# Patient Record
Sex: Female | Born: 1975 | Race: White | Hispanic: No | Smoking: Current every day smoker
Health system: Southern US, Community
[De-identification: ages and names within clinical notes are randomized; demographics above are authoritative.]

## PROBLEM LIST (undated history)

## (undated) DIAGNOSIS — E119 Type 2 diabetes mellitus without complications: Secondary | ICD-10-CM

## (undated) DIAGNOSIS — I219 Acute myocardial infarction, unspecified: Secondary | ICD-10-CM

## (undated) DIAGNOSIS — N200 Calculus of kidney: Secondary | ICD-10-CM

## (undated) HISTORY — DX: Type 2 diabetes mellitus without complications: E11.9

## (undated) HISTORY — DX: Acute myocardial infarction, unspecified: I21.9

## (undated) HISTORY — DX: Calculus of kidney: N20.0

## (undated) HISTORY — PX: CHOLECYSTECTOMY: SHX55

---

## 2008-03-30 ENCOUNTER — Ambulatory Visit: Payer: Self-pay | Admitting: Cardiology

## 2008-08-02 ENCOUNTER — Ambulatory Visit (HOSPITAL_COMMUNITY): Admission: RE | Admit: 2008-08-02 | Discharge: 2008-08-02 | Payer: Self-pay | Admitting: Unknown Physician Specialty

## 2008-09-03 ENCOUNTER — Encounter: Admission: RE | Admit: 2008-09-03 | Discharge: 2008-09-03 | Payer: Self-pay | Admitting: Unknown Physician Specialty

## 2008-09-03 ENCOUNTER — Ambulatory Visit (HOSPITAL_COMMUNITY): Admission: RE | Admit: 2008-09-03 | Discharge: 2008-09-03 | Payer: Self-pay | Admitting: Unknown Physician Specialty

## 2008-09-17 ENCOUNTER — Ambulatory Visit (HOSPITAL_COMMUNITY): Admission: RE | Admit: 2008-09-17 | Discharge: 2008-09-17 | Payer: Self-pay | Admitting: Unknown Physician Specialty

## 2008-10-15 ENCOUNTER — Ambulatory Visit (HOSPITAL_COMMUNITY): Admission: RE | Admit: 2008-10-15 | Discharge: 2008-10-15 | Payer: Self-pay | Admitting: Unknown Physician Specialty

## 2008-10-16 ENCOUNTER — Ambulatory Visit: Payer: Self-pay | Admitting: Cardiology

## 2008-10-16 ENCOUNTER — Encounter: Payer: Self-pay | Admitting: Cardiology

## 2008-10-16 DIAGNOSIS — F411 Generalized anxiety disorder: Secondary | ICD-10-CM | POA: Insufficient documentation

## 2008-10-16 DIAGNOSIS — E119 Type 2 diabetes mellitus without complications: Secondary | ICD-10-CM

## 2008-10-16 DIAGNOSIS — R0602 Shortness of breath: Secondary | ICD-10-CM | POA: Insufficient documentation

## 2008-10-16 DIAGNOSIS — I10 Essential (primary) hypertension: Secondary | ICD-10-CM

## 2008-11-12 ENCOUNTER — Ambulatory Visit (HOSPITAL_COMMUNITY): Admission: RE | Admit: 2008-11-12 | Discharge: 2008-11-12 | Payer: Self-pay | Admitting: Unknown Physician Specialty

## 2008-12-04 ENCOUNTER — Ambulatory Visit (HOSPITAL_COMMUNITY): Admission: RE | Admit: 2008-12-04 | Discharge: 2008-12-04 | Payer: Self-pay | Admitting: Unknown Physician Specialty

## 2008-12-24 ENCOUNTER — Inpatient Hospital Stay (HOSPITAL_COMMUNITY): Admission: AD | Admit: 2008-12-24 | Discharge: 2008-12-24 | Payer: Self-pay | Admitting: Obstetrics & Gynecology

## 2008-12-24 ENCOUNTER — Ambulatory Visit: Payer: Self-pay | Admitting: Family Medicine

## 2009-01-03 ENCOUNTER — Ambulatory Visit (HOSPITAL_COMMUNITY): Admission: RE | Admit: 2009-01-03 | Discharge: 2009-01-03 | Payer: Self-pay | Admitting: Unknown Physician Specialty

## 2009-07-28 IMAGING — US US OB FOLLOW-UP
1 series · 18 of 28 positions shown · non-contrast
Comparison: none

OBSTETRICAL ULTRASOUND:
 This ultrasound was performed in The [HOSPITAL], and the AS OB/GYN report will be stored to [REDACTED] PACS.

[Series 1: us ob follow-up · 18 of 36 slices shown]
[im 1/36]
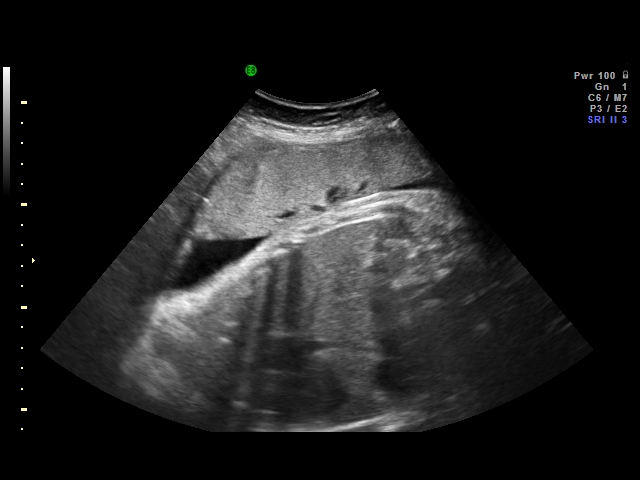
[im 3/36]
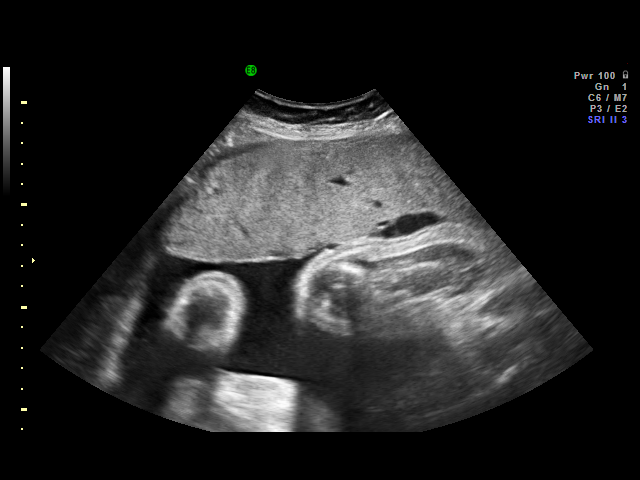
[im 4/36]
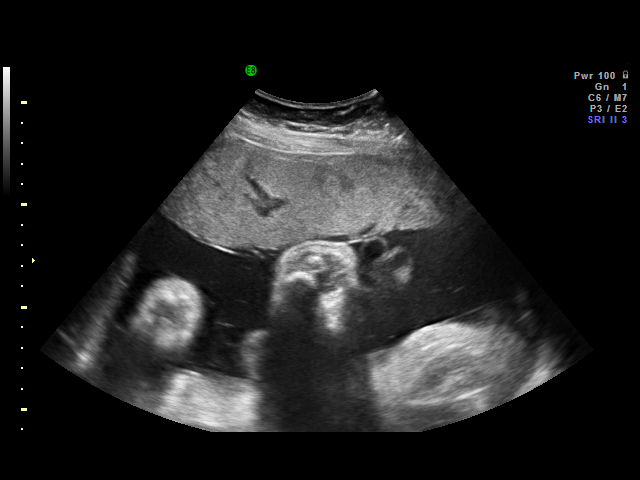
[im 7/36]
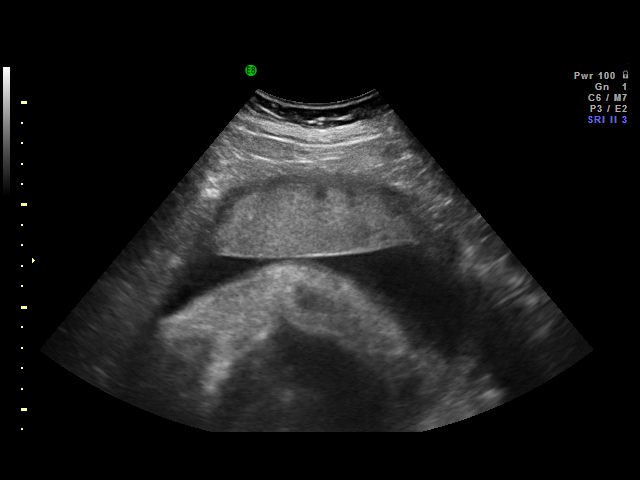
[im 10/36]
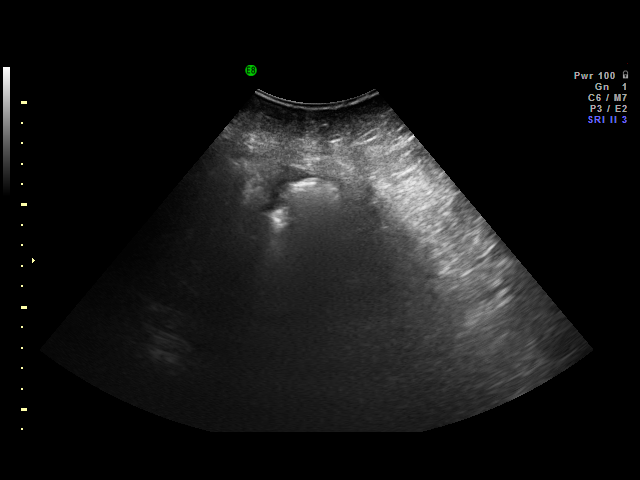
[im 11/36]
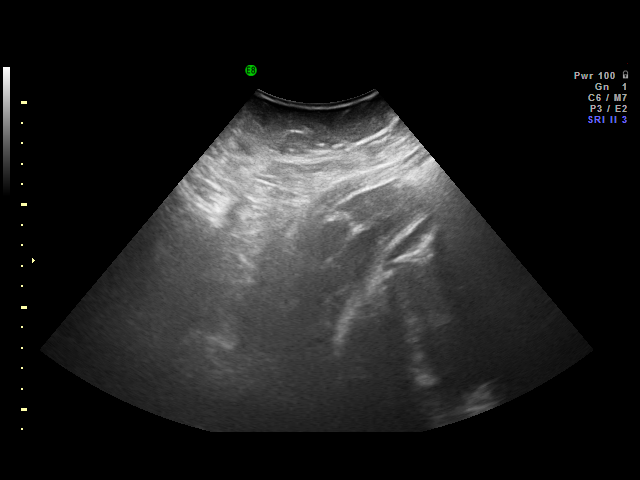
[im 13/36]
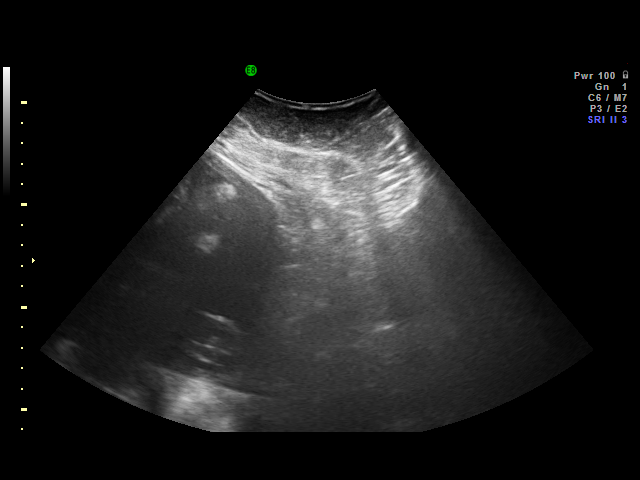
[im 15/36]
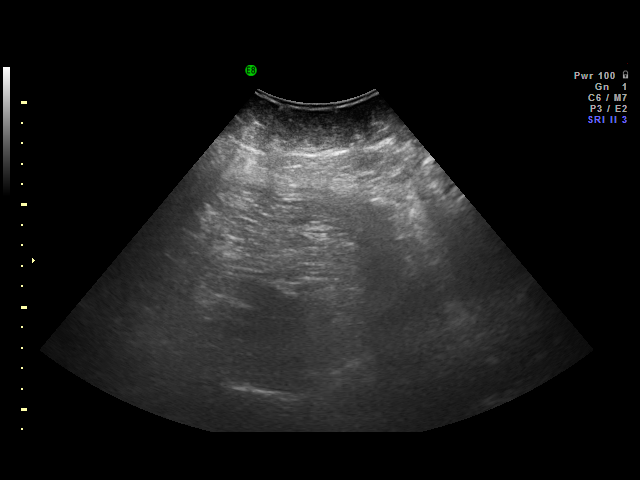
[im 17/36]
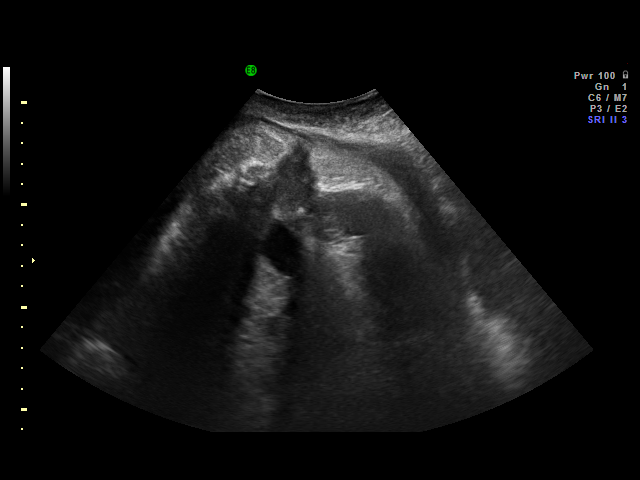
[im 19/36]
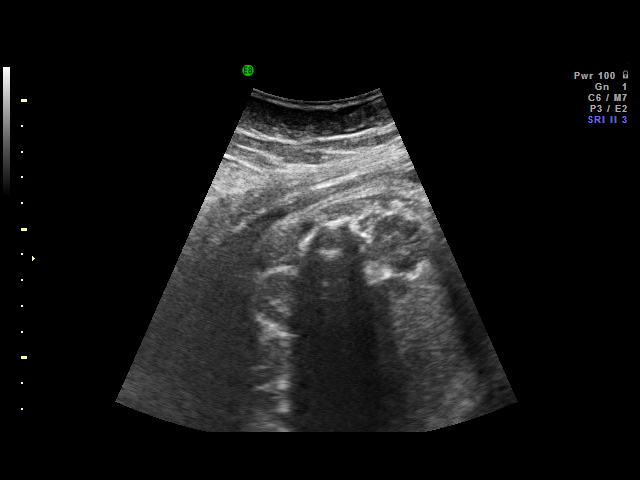
[im 21/36]
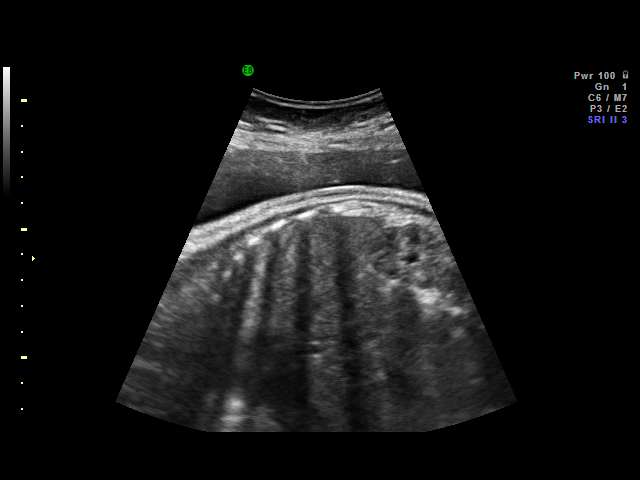
[im 23/36]
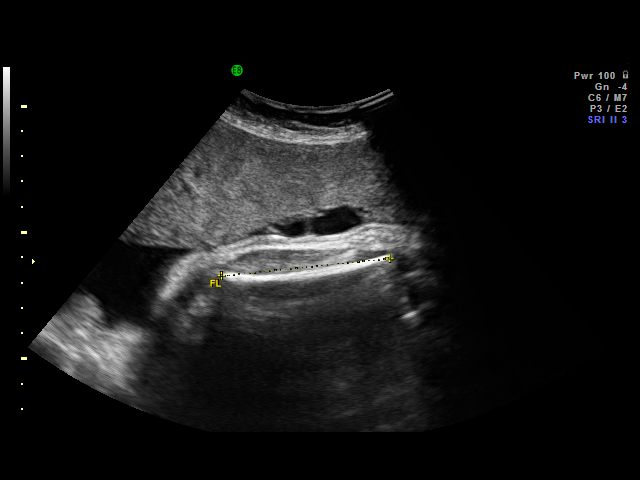
[im 25/36]
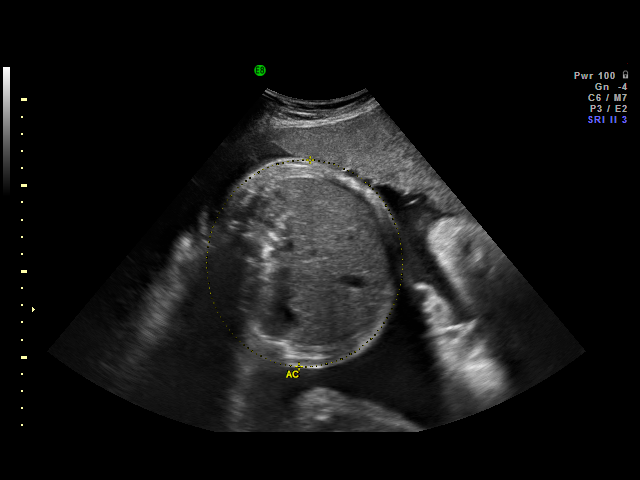
[im 28/36]
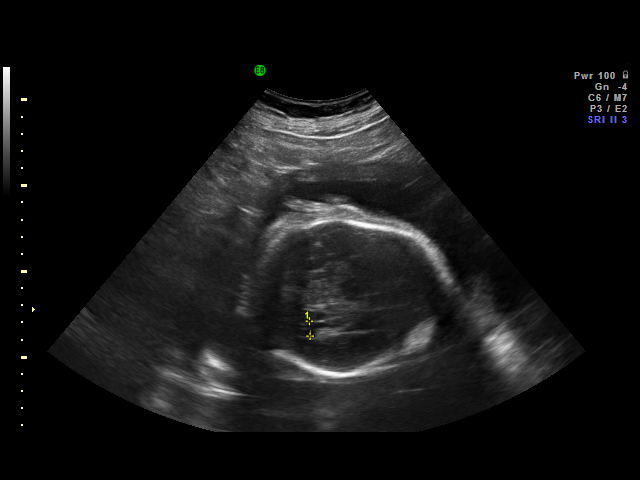
[im 29/36]
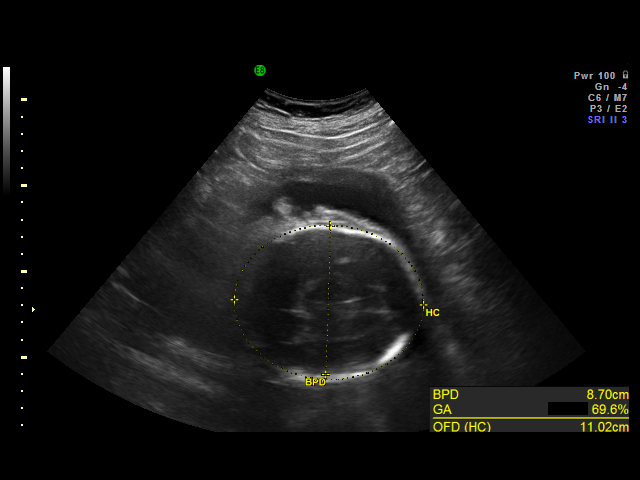
[im 32/36]
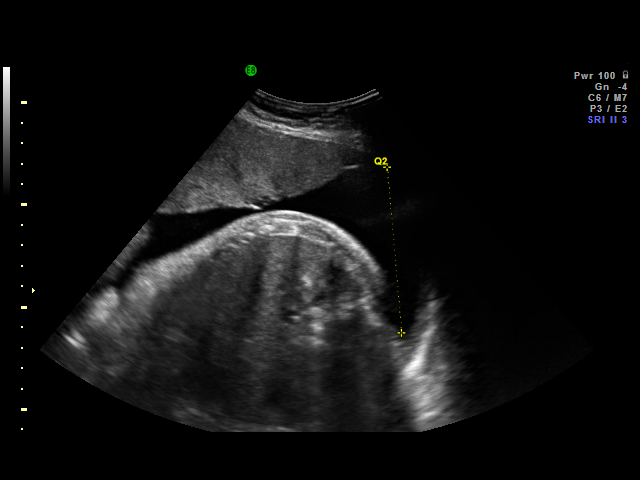
[im 33/36]
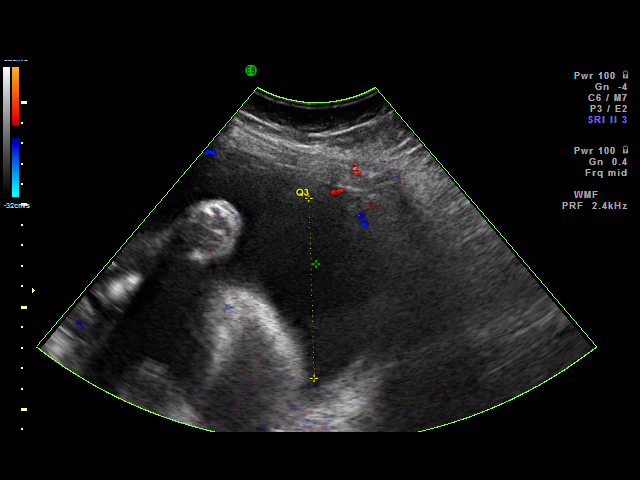
[im 36/36]
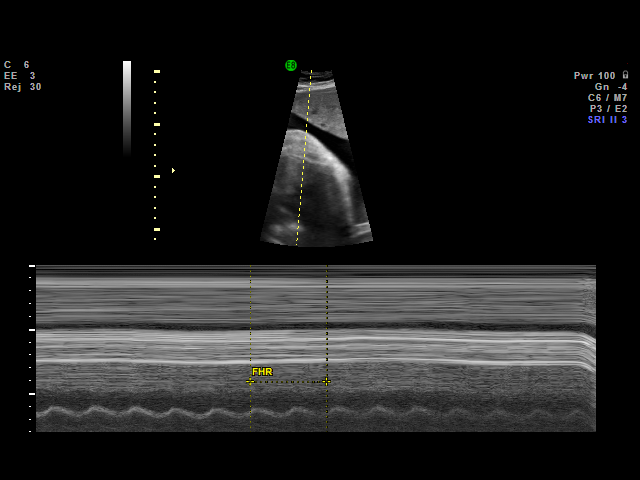

[18 of 28 positions shown; findings below may reference images not displayed]

IMPRESSION: AS OB/GYN has also been faxed to the ordering physician.

## 2010-12-07 ENCOUNTER — Encounter: Payer: Self-pay | Admitting: Unknown Physician Specialty

## 2011-03-03 LAB — FETAL FIBRONECTIN: Fetal Fibronectin: NEGATIVE

## 2011-03-03 LAB — GC/CHLAMYDIA PROBE AMP, GENITAL
Chlamydia, DNA Probe: NEGATIVE
GC Probe Amp, Genital: NEGATIVE

## 2011-03-03 LAB — URINALYSIS, ROUTINE W REFLEX MICROSCOPIC
Glucose, UA: >1000 mg/dL — AB
Hgb urine dipstick: NEGATIVE
Ketones, ur: NEGATIVE mg/dL
Leukocytes, UA: NEGATIVE
Nitrite: NEGATIVE
Specific Gravity, Urine: 1.02 (ref 1.005–1.030)
Urobilinogen, UA: 0.2 mg/dL (ref 0.0–1.0)

## 2011-03-03 LAB — URINE MICROSCOPIC-ADD ON

## 2011-03-03 LAB — STREP B DNA PROBE

## 2011-03-03 LAB — WET PREP, GENITAL: Clue Cells Wet Prep HPF POC: NONE SEEN

## 2016-09-08 ENCOUNTER — Encounter: Payer: Self-pay | Admitting: Pediatrics

## 2016-09-08 ENCOUNTER — Ambulatory Visit (INDEPENDENT_AMBULATORY_CARE_PROVIDER_SITE_OTHER): Payer: Medicaid Other | Admitting: Pediatrics

## 2016-09-08 VITALS — BP 183/101 | HR 78 | Temp 98.7°F | Ht 64.0 in | Wt 238.0 lb

## 2016-09-08 DIAGNOSIS — Z720 Tobacco use: Secondary | ICD-10-CM

## 2016-09-08 DIAGNOSIS — F419 Anxiety disorder, unspecified: Principal | ICD-10-CM

## 2016-09-08 DIAGNOSIS — Z23 Encounter for immunization: Secondary | ICD-10-CM | POA: Diagnosis not present

## 2016-09-08 DIAGNOSIS — F418 Other specified anxiety disorders: Secondary | ICD-10-CM | POA: Diagnosis not present

## 2016-09-08 DIAGNOSIS — I10 Essential (primary) hypertension: Secondary | ICD-10-CM

## 2016-09-08 DIAGNOSIS — F329 Major depressive disorder, single episode, unspecified: Secondary | ICD-10-CM

## 2016-09-08 MED ORDER — HYDROCHLOROTHIAZIDE 25 MG PO TABS: 25.0000 mg | ORAL_TABLET | Freq: Every day | ORAL | 3 refills | Status: DC

## 2016-09-08 MED ORDER — ESCITALOPRAM OXALATE 10 MG PO TABS: 10.0000 mg | ORAL_TABLET | Freq: Every day | ORAL | 2 refills | Status: DC

## 2016-09-08 NOTE — Progress Notes (Signed)
Subjective:   Patient ID: Mary Riggs, female    DOB: 1976/06/01, 40 y.o.   MRN: [id number] CC: New Patient (Initial Visit); Hypertension; Left shoulder pain; and Nevus  HPI: Mary Riggs is a 40 y.o. female presenting for New Patient (Initial Visit); Hypertension; Left shoulder pain; and Nevus  HTN: ongoing for years, had HTN during pregnancy 7 yrs ago Also had gestational diabetes Headaches daily, can tolerate it, takes tylenol if it gets bad No chest pain  Anxiety: Ongoing for years Panic attacks every day Feels on edge all the time  10yo son, 1 yo daughter at home Mood has been down Has passive thoughts abou tnot wanting to be here No plan of suicide or self harm  Eats snacks throughout day Has had a lot of weight gain over past few months No physical activity  Smoking daily 2 ppd, wants to quit but helps with anxiety now  Trouble sleeping due to anxiety  PGF and PGM with heart disease  GAD 7 : Generalized Anxiety Score 09/08/2016  Nervous, Anxious, on Edge 3  Control/stop worrying 3  Worry too much - different things 3  Trouble relaxing 3  Restless 3  Easily annoyed or irritable 3  Afraid - awful might happen 3  Total GAD 7 Score 21  Anxiety Difficulty Very difficult   Depression screen PHQ 2/9 09/08/2016  Decreased Interest 3  Down, Depressed, Hopeless 3  PHQ - 2 Score 6  Altered sleeping 3  Tired, decreased energy 3  Change in appetite 3  Trouble concentrating 3  Moving slowly or fidgety/restless 3  Suicidal thoughts 0  PHQ-9 Score 21  Difficult doing work/chores Very difficult     Past Medical History:  Diagnosis Date  . Kidney stones    Family History  Problem Relation Age of Onset  . Cancer Father   . Hypertension Father   . Diabetes Paternal Uncle   . Hyperlipidemia Paternal Uncle   . Hypertension Paternal Uncle   . Arthritis Paternal Grandmother   . Asthma Paternal Grandmother   . Diabetes Paternal Grandmother   . Heart  disease Paternal Grandmother   . Hyperlipidemia Paternal Grandmother   . Hypertension Paternal Grandmother   . Miscarriages / Stillbirths Paternal Grandmother   . Heart disease Paternal Grandfather    Social History   Social History  . Marital status: Divorced    Spouse name: N/A  . Number of children: N/A  . Years of education: N/A   Social History Main Topics  . Smoking status: Current Every Day Smoker    Packs/day: 2.00    Types: Cigarettes  . Smokeless tobacco: Never Used  . Alcohol use No  . Drug use: No  . Sexual activity: Yes    Birth control/ protection: Condom   Other Topics Concern  . None   Social History Narrative  . None   ROS: All systems negative other than what is in HPI  Objective:    BP (!) 183/101   Pulse 78   Temp 98.7 F (37.1 C) (Oral)   Ht _0  (1.626 m)   Wt 238 lb (108 kg)   LMP 08/28/2016   BMI 40.85 kg/m   Wt Readings from Last 3 Encounters:  09/08/16 238 lb (108 kg)  10/16/08 (!) 302 lb (137 kg)    Gen: NAD, alert, cooperative with exam, NCAT EYES: EOMI, no conjunctival injection, or no icterus ENT:   OP without erythema LYMPH: no cervical LAD CV: NRRR, normal  S1/S2, no murmur, distal pulses 2+ b/l Resp: CTABL, no wheezes, normal WOB Abd: +BS, soft, NTND. no guarding or organomegaly Ext: No edema, warm Neuro: Alert and oriented, strength equal b/l UE and LE, coordination grossly normal MSK: normal muscle bulk Psych: nervous, constantly bouncing legs, full affect, tearful at times, passive thoughts of SI, no plan  Assessment & Plan:  Mary Riggs was seen today for new patient (initial visit), hypertension, multiple med problem f/u.  Diagnoses and all orders for this visit:  Anxiety and depression Uncontrolled anxiety and depression symptoms Not on treatment Feels safe at home Gave list of counselors -     TSH -     escitalopram (LEXAPRO) 10 MG tablet; Take 1 tablet (10 mg total) by mouth daily.  Tobacco abuse Pt  interested in cessation, not ready now  Essential hypertension Uncontrolled No symptoms now -     CMP14+EGFR -     Lipid panel -     Urinalysis -     hydrochlorothiazide (HYDRODIURIL) 25 MG tablet; Take 1 tablet (25 mg total) by mouth daily.  Severe obesity (BMI >= 40) (HCC) Walk daily after dropping kids off af school Discussed lifestyle changes including increasing physical activity, minimizing snack foods -     Bayer DCA Hb A1c Waived -     Lipid panel -     TSH  Encounter for immunization -     Flu Vaccine QUAD 36+ mos IM   Follow up plan: Return in about 2 weeks (around 09/22/2016) for med follow up, sooner if needed.  Assunta Found, MD [place]

## 2016-09-21 ENCOUNTER — Ambulatory Visit: Payer: Medicaid Other | Admitting: Pediatrics

## 2016-09-25 ENCOUNTER — Ambulatory Visit (INDEPENDENT_AMBULATORY_CARE_PROVIDER_SITE_OTHER): Payer: Medicaid Other | Admitting: Pediatrics

## 2016-09-25 ENCOUNTER — Encounter: Payer: Self-pay | Admitting: Pediatrics

## 2016-09-25 VITALS — BP 165/109 | HR 79 | Temp 97.7°F | Ht 64.0 in | Wt 233.0 lb

## 2016-09-25 DIAGNOSIS — I10 Essential (primary) hypertension: Secondary | ICD-10-CM | POA: Diagnosis not present

## 2016-09-25 DIAGNOSIS — F411 Generalized anxiety disorder: Secondary | ICD-10-CM

## 2016-09-25 LAB — URINALYSIS
BILIRUBIN UA: NEGATIVE
GLUCOSE, UA: NEGATIVE
KETONES UA: NEGATIVE
Leukocytes, UA: NEGATIVE
NITRITE UA: NEGATIVE
Protein, UA: NEGATIVE
SPEC GRAV UA: 1.005 (ref 1.005–1.030)
UUROB: 0.2 mg/dL (ref 0.2–1.0)
pH, UA: 5 (ref 5.0–7.5)

## 2016-09-25 LAB — BAYER DCA HB A1C WAIVED: HB A1C: 6.1 % (ref <7.0)

## 2016-09-25 MED ORDER — CITALOPRAM HYDROBROMIDE 10 MG PO TABS
10.0000 mg | ORAL_TABLET | Freq: Every day | ORAL | 3 refills | Status: DC
Start: 2016-09-25 — End: 2016-10-28

## 2016-09-25 MED ORDER — LISINOPRIL 20 MG PO TABS: 20.0000 mg | ORAL_TABLET | Freq: Every day | ORAL | 3 refills | Status: DC

## 2016-09-25 MED ORDER — HYDROXYZINE HCL 10 MG PO TABS: 10.0000 mg | ORAL_TABLET | Freq: Three times a day (TID) | ORAL | 0 refills | Status: DC | PRN

## 2016-09-25 NOTE — Progress Notes (Signed)
  Subjective:   Patient ID: Mary Riggs, female    DOB: 09/04/1976, 40 y.o.   MRN: [id number] CC: Follow-up (Anxiety, Nausea with medicaiton)  HPI: Mary Riggs is a 40 y.o. female presenting for Follow-up (Anxiety, Nausea with medicaiton)  Has been taking anxiety medication with food First week having nausea, diarrhea after lexapro Able to get up and more now No thoughts of self harm Anxiety is not much better  Living at home alone with kids No contact with ex for over two months  No headaches, no chest pain BPs at home have been 160s Taking HCTZ daily Did not get labs drawn before  Depression screen [facility] 2/9 09/25/2016 09/08/2016  Decreased Interest 3 3  Down, Depressed, Hopeless 3 3  PHQ - 2 Score 6 6  Altered sleeping 3 3  Tired, decreased energy 3 3  Change in appetite 2 3  Feeling bad or failure about yourself  3 -  Trouble concentrating 3 3  Moving slowly or fidgety/restless 3 3  Suicidal thoughts 0 0  PHQ-9 Score 23 21  Difficult doing work/chores Very difficult Very difficult     Relevant past medical, surgical, family and social history reviewed. Allergies and medications reviewed and updated. History  Smoking Status  . Current Every Day Smoker  . Packs/day: 2.00  . Types: Cigarettes  Smokeless Tobacco  . Never Used   ROS: Per HPI   Objective:    BP (!) 165/109   Pulse 79   Temp 97.7 F (36.5 C) (Oral)   Ht 5\' 4"  (1.626 m)   Wt 233 lb (105.7 kg)   LMP 08/28/2016   BMI 39.99 kg/m   Wt Readings from Last 3 Encounters:  09/25/16 233 lb (105.7 kg)  09/08/16 238 lb (108 kg)  10/16/08 (!) 302 lb (137 kg)    Gen: NAD, alert, cooperative with exam, NCAT EYES: EOMI, no conjunctival injection, or no icterus CV: NRRR, normal S1/S2, no murmur, distal pulses 2+ b/l Resp: CTABL, no wheezes, normal WOB Ext: No edema, warm Neuro: Alert and oriented Psych: increased psychomotor agitation, twitching leg constantly in visit Denies SI  Assessment  & Plan:  Belanna was seen today for follow-up anxiety, HTN.  Diagnoses and all orders for this visit:  Generalized anxiety disorder Feeling safe at home Ongoing symptoms S/e from lexapro as above Will switch to celexa, can do lower dose Take 5mg  for 2 weeks, then increase to 10 mg Start Monday Stop lexapro now Needs to set up counseling appt Medicaid can help with drive -     citalopram (CELEXA) 10 MG tablet; Take 1 tablet (10 mg total) by mouth daily. -     hydrOXYzine (ATARAX/VISTARIL) 10 MG tablet; Take 1 tablet (10 mg total) by mouth 3 (three) times daily as needed.  Essential hypertension Remains elevated today Cont HCTZ Start lsinopril Labs today, didn't get drawn last visit -     lisinopril (PRINIVIL,ZESTRIL) 20 MG tablet; Take 1 tablet (20 mg total) by mouth daily.   Follow up plan: Return in about 4 weeks (around 10/23/2016). Rex Krasarol Vincent, MD [facility]

## 2016-09-25 NOTE — Patient Instructions (Addendum)
[facility]  Start new anxiety medication citalopram/celexa on Monday

## 2016-09-26 LAB — CMP14+EGFR
A/G RATIO: 1.4 (ref 1.2–2.2)
ALBUMIN: 4.2 g/dL (ref 3.5–5.5)
ALK PHOS: 82 IU/L (ref 39–117)
ALT: 18 IU/L (ref 0–32)
AST: 21 IU/L (ref 0–40)
BILIRUBIN TOTAL: 0.4 mg/dL (ref 0.0–1.2)
BUN / CREAT RATIO: 9 (ref 9–23)
BUN: 7 mg/dL (ref 6–24)
CHLORIDE: 95 mmol/L — AB (ref 96–106)
CO2: 29 mmol/L (ref 18–29)
Calcium: 9.4 mg/dL (ref 8.7–10.2)
Creatinine, Ser: 0.78 mg/dL (ref 0.57–1.00)
GFR calc Af Amer: 110 mL/min/{1.73_m2} (ref >59)
GFR calc non Af Amer: 95 mL/min/{1.73_m2} (ref >59)
GLOBULIN, TOTAL: 2.9 g/dL (ref 1.5–4.5)
GLUCOSE: 140 mg/dL — AB (ref 65–99)
Potassium: 3.2 mmol/L — ABNORMAL LOW (ref 3.5–5.2)
SODIUM: 139 mmol/L (ref 134–144)
Total Protein: 7.1 g/dL (ref 6.0–8.5)

## 2016-09-26 LAB — LIPID PANEL
CHOLESTEROL TOTAL: 201 mg/dL — AB (ref 100–199)
Chol/HDL Ratio: 3.8 ratio units (ref 0.0–4.4)
HDL: 53 mg/dL (ref >39)
LDL Calculated: 124 mg/dL — ABNORMAL HIGH (ref 0–99)
Triglycerides: 119 mg/dL (ref 0–149)
VLDL Cholesterol Cal: 24 mg/dL (ref 5–40)

## 2016-09-26 LAB — TSH: TSH: 3.58 u[IU]/mL (ref 0.450–4.500)

## 2016-10-28 ENCOUNTER — Encounter: Payer: Self-pay | Admitting: Pediatrics

## 2016-10-28 ENCOUNTER — Ambulatory Visit (INDEPENDENT_AMBULATORY_CARE_PROVIDER_SITE_OTHER): Payer: Medicaid Other | Admitting: Pediatrics

## 2016-10-28 VITALS — BP 107/77 | HR 86 | Temp 97.7°F | Ht 64.0 in | Wt 234.0 lb

## 2016-10-28 DIAGNOSIS — I10 Essential (primary) hypertension: Secondary | ICD-10-CM

## 2016-10-28 DIAGNOSIS — E876 Hypokalemia: Secondary | ICD-10-CM | POA: Diagnosis not present

## 2016-10-28 DIAGNOSIS — F411 Generalized anxiety disorder: Secondary | ICD-10-CM

## 2016-10-28 LAB — URINALYSIS, COMPLETE
Bilirubin, UA: NEGATIVE
GLUCOSE, UA: NEGATIVE
Ketones, UA: NEGATIVE
Leukocytes, UA: NEGATIVE
Nitrite, UA: NEGATIVE
PROTEIN UA: NEGATIVE
Specific Gravity, UA: <1.005 — ABNORMAL LOW (ref 1.005–1.030)
Urobilinogen, Ur: 0.2 mg/dL (ref 0.2–1.0)
pH, UA: 5 (ref 5.0–7.5)

## 2016-10-28 LAB — BMP8+EGFR
BUN / CREAT RATIO: 13 (ref 9–23)
BUN: 12 mg/dL (ref 6–24)
CO2: 23 mmol/L (ref 18–29)
CREATININE: 0.9 mg/dL (ref 0.57–1.00)
Calcium: 9.4 mg/dL (ref 8.7–10.2)
Chloride: 97 mmol/L (ref 96–106)
GFR calc Af Amer: 92 mL/min/{1.73_m2} (ref >59)
GFR, EST NON AFRICAN AMERICAN: 80 mL/min/{1.73_m2} (ref >59)
Glucose: 130 mg/dL — ABNORMAL HIGH (ref 65–99)
Potassium: 4.5 mmol/L (ref 3.5–5.2)
SODIUM: 137 mmol/L (ref 134–144)

## 2016-10-28 LAB — MICROSCOPIC EXAMINATION: RENAL EPITHEL UA: NONE SEEN /HPF

## 2016-10-28 MED ORDER — CITALOPRAM HYDROBROMIDE 10 MG PO TABS: 20.0000 mg | ORAL_TABLET | Freq: Every day | ORAL | 3 refills | Status: DC

## 2016-10-28 MED ORDER — HYDROXYZINE HCL 25 MG PO TABS: 25.0000 mg | ORAL_TABLET | Freq: Three times a day (TID) | ORAL | 0 refills | Status: DC | PRN

## 2016-10-28 MED ORDER — LISINOPRIL 10 MG PO TABS: 10.0000 mg | ORAL_TABLET | Freq: Every day | ORAL | 4 refills | Status: DC

## 2016-10-28 NOTE — Patient Instructions (Addendum)
Lisinopril take 10mg  every day (decreasing from 20mg  every day)  Citalopram: take 10mg  every day for 1 week. Then increase to 20mg . (Full tab for a week, then two tabs) We can increase every 2 weeks if needed, but I want to talk with you first by phone before if needed  Call for counseling appt  Your provider wants you to schedule an appointment with a Psychologist/Psychiatrist. The following list of offices requires the patient to call and make their own appointment, as there is information they need that only you can provide. Please feel free to choose form the following providers:  [facility]   [phone] Crisis [place] [phone]  [facility]  [phone]   [address] [place], [place]  (Scheduled through [facility]) Must call and do an interview for appointment. Sees Children / Accepts Medicaid  [facility]    [phone]  [address]    [place], [place]       [facility]  [phone] [address] [place], [place]  Evaluates for Autism but does not treat it Sees Children / Accepts Medicaid  [facility]    [phone] [address], [address]   [place], [place] Medication management, substance abuse, bipolar, grief, family, marriage, OCD, anxiety, PTSD Sees children / Accepts Medicaid  [facility]    [phone] [address] [place], [place] Sees children / Accepts [facility]  [facility]  [phone] [address] [place], [place]   Dr Estelle GrumblesAkinlayo     [phone] [address] [place], [place]  Sees ADD & ADHD for treatment Accepts Medicaid  [facility]  [phone] [address] [place], [place] Evaluates for Autism [facility]s [facility]  [facility]   [phone] [place], [place]  Does Adult ADD evaluations Does not accept Medicaid  [facility]   [phone] [address]   [place], [place] Uses animal therapy  Sees children as young as 40 years old Accepts Medicaid

## 2016-10-28 NOTE — Progress Notes (Signed)
  Subjective:   Patient ID: Mary Riggs, female    DOB: [birth year], 40 y.o.   MRN: [id number] CC: Follow-up (1 month) multiple med problems HPI: Mary Riggs is a 40 y.o. female presenting for Follow-up (1 month)  HTN: at home 110s/70s  Anxiety: ongoing symptoms Interested in increasing medication Hydroxyzine helping minimally with symptoms  Pre-diabetes: only on water, occasionally has diet drink No caffeine for the most part No regular sodas, no sugar  Relevant past medical, surgical, family and social history reviewed. Allergies and medications reviewed and updated. History  Smoking Status  . Current Every Day Smoker  . Packs/day: 2.00  . Types: Cigarettes  Smokeless Tobacco  . Never Used   ROS: Per HPI   Objective:    BP 107/77   Pulse 86   Temp 97.7 F (36.5 C) (Oral)   Ht '5\' 4"'$  (1.626 m)   Wt 234 lb (106.1 kg)   BMI 40.17 kg/m   Wt Readings from Last 3 Encounters:  10/28/16 234 lb (106.1 kg)  09/25/16 233 lb (105.7 kg)  09/08/16 238 lb (108 kg)    Gen: NAD, alert, cooperative with exam, NCAT EYES: EOMI, no conjunctival injection, or no icterus CV: NRRR, normal S1/S2, no murmur, distal pulses 2+ b/l Resp: CTABL, no wheezes, normal WOB Abd: +BS, soft, NTND. no guarding or organomegaly Ext: No edema, warm Neuro: Alert and oriented, strength equal b/l UE and LE, coordination grossly normal MSK: normal muscle bulk Psych: jiggling leg constantly, no thoughts of self harm, full affect  Assessment & Plan:  Mary Riggs was seen today for follow-up multiple med problems.  Diagnoses and all orders for this visit:  Hypokalemia On HCTZ Repeat BMP, not on K supplement now -     BMP8+EGFR  Essential hypertension Well controlled Sometimes lightheaded at home Will decrease to '10mg'$  lisinopril daily -     lisinopril (PRINIVIL,ZESTRIL) 10 MG tablet; Take 1 tablet (10 mg total) by mouth daily. -     Urinalysis, Complete  Generalized anxiety  disorder Increase citalopram, hydroxyzine Call for counseling appt, f/u in 8 weeks, sooner if needed -     citalopram (CELEXA) 10 MG tablet; Take 2 tablets (20 mg total) by mouth daily. -     hydrOXYzine (ATARAX/VISTARIL) 25 MG tablet; Take 1 tablet (25 mg total) by mouth 3 (three) times daily as needed.   Follow up plan: Return in about 8 weeks (around 12/23/2016). Assunta Found, MD [place]

## 2016-12-07 ENCOUNTER — Telehealth: Payer: Self-pay | Admitting: Pediatrics

## 2016-12-07 DIAGNOSIS — F411 Generalized anxiety disorder: Secondary | ICD-10-CM

## 2016-12-07 NOTE — Telephone Encounter (Signed)
Patient states that BP seems to be on the low side on some days It has been around 110/60, and 90/50. Her anxiety medication still does not seem to be taking the edge off. Her dad is being placed with hospice and they have been told that he has just a matter of days. Patient wants to know if there is anything else she can take to help with the anxiety with what is going on with her dad. Patient states that you had requested a phone call in about 4 weeks to see how things were going.

## 2016-12-09 ENCOUNTER — Telehealth: Payer: Self-pay | Admitting: Pediatrics

## 2016-12-09 MED ORDER — CITALOPRAM HYDROBROMIDE 40 MG PO TABS: 40.0000 mg | ORAL_TABLET | Freq: Every day | ORAL | 2 refills | Status: DC

## 2016-12-09 NOTE — Telephone Encounter (Signed)
Please review and advise.

## 2016-12-09 NOTE — Telephone Encounter (Signed)
Rx called in. Patient aware and verbalizes understanding.  

## 2016-12-09 NOTE — Telephone Encounter (Signed)
Stop lisinopril Keep checking BPs at home, let me know if >140 or >90 regularly Continue HCTZ OK to call in xanax 0.25mg  take BID prn anxiety, #10 tabs no refills for acute grief response, woul dneed to be seen for any further refills Should also increase citalopram to 40mg  daily, I sent that in

## 2017-01-26 ENCOUNTER — Ambulatory Visit: Payer: Medicaid Other | Admitting: Pediatrics

## 2017-01-27 ENCOUNTER — Telehealth: Payer: Self-pay | Admitting: Pediatrics

## 2017-01-27 ENCOUNTER — Encounter: Payer: Self-pay | Admitting: Pediatrics

## 2017-01-28 ENCOUNTER — Other Ambulatory Visit: Payer: Self-pay | Admitting: Pediatrics

## 2017-01-28 DIAGNOSIS — I10 Essential (primary) hypertension: Secondary | ICD-10-CM

## 2017-04-09 ENCOUNTER — Encounter: Payer: Self-pay | Admitting: Pediatrics

## 2017-04-09 ENCOUNTER — Ambulatory Visit (INDEPENDENT_AMBULATORY_CARE_PROVIDER_SITE_OTHER): Payer: Medicaid Other

## 2017-04-09 ENCOUNTER — Ambulatory Visit (INDEPENDENT_AMBULATORY_CARE_PROVIDER_SITE_OTHER): Payer: Medicaid Other | Admitting: Pediatrics

## 2017-04-09 VITALS — BP 136/91 | HR 72 | Temp 98.3°F | Ht 64.0 in | Wt 240.4 lb

## 2017-04-09 DIAGNOSIS — M25511 Pain in right shoulder: Secondary | ICD-10-CM | POA: Diagnosis not present

## 2017-04-09 DIAGNOSIS — F411 Generalized anxiety disorder: Secondary | ICD-10-CM | POA: Diagnosis not present

## 2017-04-09 DIAGNOSIS — G8929 Other chronic pain: Secondary | ICD-10-CM

## 2017-04-09 DIAGNOSIS — M542 Cervicalgia: Secondary | ICD-10-CM

## 2017-04-09 DIAGNOSIS — R202 Paresthesia of skin: Secondary | ICD-10-CM | POA: Diagnosis not present

## 2017-04-09 DIAGNOSIS — M1990 Unspecified osteoarthritis, unspecified site: Secondary | ICD-10-CM | POA: Diagnosis not present

## 2017-04-09 DIAGNOSIS — R200 Anesthesia of skin: Secondary | ICD-10-CM | POA: Diagnosis not present

## 2017-04-09 DIAGNOSIS — M2550 Pain in unspecified joint: Secondary | ICD-10-CM | POA: Diagnosis not present

## 2017-04-09 LAB — BAYER DCA HB A1C WAIVED: HB A1C: 6.3 % (ref <7.0)

## 2017-04-09 MED ORDER — CITALOPRAM HYDROBROMIDE 20 MG PO TABS: 20.0000 mg | ORAL_TABLET | Freq: Every day | ORAL | 0 refills | Status: DC

## 2017-04-09 MED ORDER — DULOXETINE HCL 20 MG PO CPEP: ORAL_CAPSULE | ORAL | 2 refills | Status: DC

## 2017-04-09 NOTE — Progress Notes (Signed)
Subjective:   Patient ID: Mary Riggs, female    DOB: 10/13/1976, 41 y.o.   MRN: [id number] CC: Numbness (Hands and feet) and Joint Pain (Worse in shoulders and elbows)  HPI: Mary Riggs is a 41 y.o. female presenting for Numbness (Hands and feet) and Joint Pain (Worse in shoulders and elbows)  Pain in shoulders and elbows, always there Nothing makes it better Standing, sitting, lying hurt all the same Moving shoulders makes the pain worse Has tried tylenol, not helping Doesn't get out much in public, mostly at home Thinks it has been going on for months Feels like her bones are hurting Feet and knees bother her some too Thinks joints rarely gotten swollen if ever, no redness Pain used to come and go in flares, recently there most of time  Has numbness in her hands and feet Legs burn all the time throughout legs Started tingling in her hands about a year ago There all the time, getting worse  Depression: mood remains down, pt says no worse than before No thoughts of not wanting to be here or of self harm Doesn't think celexa is helping anymore Continues to feel anxious all the time  Tobacco: about 1 ppd Smokes weed on the weekends, not every day No EtOH use No other drug use  Depression screen [facility] 2/9 04/09/2017 10/28/2016 09/25/2016 09/08/2016  Decreased Interest 3 3 3 3   Down, Depressed, Hopeless 3 3 3 3   PHQ - 2 Score 6 6 6 6   Altered sleeping 3 3 3 3   Tired, decreased energy 3 3 3 3   Change in appetite 3 3 2 3   Feeling bad or failure about yourself  2 3 3  -  Trouble concentrating 3 3 3 3   Moving slowly or fidgety/restless 3 3 3 3   Suicidal thoughts 0 2 0 0  PHQ-9 Score 23 26 23 21   Difficult doing work/chores Very difficult Very difficult Very difficult Very difficult    GAD 7 : Generalized Anxiety Score 04/09/2017 10/28/2016 09/25/2016 09/08/2016  Nervous, Anxious, on Edge 3 3 3 3   Control/stop worrying 3 3 3 3   Worry too much - different things 3 3 3  3   Trouble relaxing 3 3 3 3   Restless 3 3 3 3   Easily annoyed or irritable 3 3 3 3   Afraid - awful might happen 3 3 3 3   Total GAD 7 Score 21 21 21 21   Anxiety Difficulty Very difficult - - Very difficult    Relevant past medical, surgical, family and social history reviewed. Allergies and medications reviewed and updated. History  Smoking Status  . Current Every Day Smoker  . Packs/day: 2.00  . Types: Cigarettes  Smokeless Tobacco  . Never Used   ROS: Per HPI   Objective:    BP (!) 136/91   Pulse 72   Temp 98.3 F (36.8 C) (Oral)   Ht 5' 4"  (1.626 m)   Wt 240 lb 6.4 oz (109 kg)   BMI 41.26 kg/m   Wt Readings from Last 3 Encounters:  04/09/17 240 lb 6.4 oz (109 kg)  10/28/16 234 lb (106.1 kg)  09/25/16 233 lb (105.7 kg)    Gen: NAD, alert, cooperative with exam, NCAT EYES: EOMI, no conjunctival injection, or no icterus ENT:  OP without erythema LYMPH: no cervical LAD CV: NRRR, normal S1/S2, no murmur Resp: CTABL, no wheezes, normal WOB Abd: +BS, soft, NTND. Ext: No edema, warm Neuro: Alert and oriented, strength equal b/l UE and  LE, coordination grossly normal, sensation intact b/l hands, arms legs MSK: normal muscle bulk Shoulders symmetric in appearance ttp soft tissue arms, forearms Pain with active and passive ROM of shoulders and elbows, does have full ROM b/l of both shoulders and elbows, no swelling or redness Rotator cuff muscles intact b/l No synovitis or swelling in hands b/l  Assessment & Plan:  Mary Riggs was seen today for numbness and joint pain.  Diagnoses and all orders for this visit:  Arthralgia, unspecified joint -     Sedimentation Rate -     C-reactive protein  Generalized anxiety disorder celexa not helping, will cross taper with duloxetine Cont to encourage counseling, list given Feels safe at home -     DULoxetine (CYMBALTA) 20 MG capsule; Take one tab daily for 8 days then two tabs  Arthritis No redness or swelling Multiple  joints affected Will check inflammation labs as above  Neck pain -     DG Cervical Spine Complete; Future  Numbness and tingling of upper and lower extremities of both sides Ongoing for some weeks Will check labs -     CMP14+EGFR -     CBC with Differential/Platelet -     TSH -     Vitamin B12 -     Folate -     Bayer DCA Hb A1c Waived -     Ambulatory referral to Neurology  Chronic right shoulder pain R shoulder and neck hurting the most out of her joints Xray as below -     DG Shoulder Right; Future   Follow up plan: Return in about 1 month (around 05/10/2017). Assunta Found, MD [place]

## 2017-04-09 NOTE — Patient Instructions (Signed)
[email or web address]  Your provider wants you to schedule an appointment with a Psychologist/Psychiatrist. The following list of offices requires the patient to call and make their own appointment, as there is information they need that only you can provide. Please feel free to choose form the following providers:  [place] [facility]   [phone] Crisis Recovery in [place] [phone]  [facility]  [phone]   [address] [place], [place]  (Scheduled through [facility]) Must call and do an interview for appointment. Sees Children / Accepts Medicaid  [facility]    [phone]  [address]    [place], [place]       [facility]  [phone] [address] [place], [place]  Evaluates for Autism but does not treat it Sees Children / Accepts Medicaid  [facility]    [phone] [address]   [place], [place] Medication management, substance abuse, bipolar, grief, family, marriage, OCD, anxiety, PTSD Sees children / Accepts Medicaid  [facility]    [phone] [address] [place]  Sees children / Accepts Medicaid  [facility]  [phone] [address] [place], [place]   Dr Akinlayo     [phone] [address] [place], [place]  Sees ADD & ADHD for treatment Accepts Medicaid  [facility]  [phone] [address] [place], [place] Evaluates for Autism Accepts Medicaid  [facility]   [phone] [address]   [place], [place] Uses animal therapy  Sees children as young as 3 years old Accepts Medicaid  [facility]     [phone]    [address]  , [place] [address] Sees children Accepts Medicaid   

## 2017-04-10 LAB — CMP14+EGFR
A/G RATIO: 1.5 (ref 1.2–2.2)
ALBUMIN: 3.8 g/dL (ref 3.5–5.5)
ALT: 12 IU/L (ref 0–32)
AST: 15 IU/L (ref 0–40)
Alkaline Phosphatase: 63 IU/L (ref 39–117)
BUN/Creatinine Ratio: 10 (ref 9–23)
BUN: 8 mg/dL (ref 6–24)
Bilirubin Total: 0.2 mg/dL (ref 0.0–1.2)
CALCIUM: 8.8 mg/dL (ref 8.7–10.2)
CO2: 22 mmol/L (ref 18–29)
CREATININE: 0.81 mg/dL (ref 0.57–1.00)
Chloride: 103 mmol/L (ref 96–106)
GFR, EST AFRICAN AMERICAN: 105 mL/min/{1.73_m2} (ref >59)
GFR, EST NON AFRICAN AMERICAN: 91 mL/min/{1.73_m2} (ref >59)
GLOBULIN, TOTAL: 2.6 g/dL (ref 1.5–4.5)
Glucose: 113 mg/dL — ABNORMAL HIGH (ref 65–99)
POTASSIUM: 4.1 mmol/L (ref 3.5–5.2)
SODIUM: 138 mmol/L (ref 134–144)
TOTAL PROTEIN: 6.4 g/dL (ref 6.0–8.5)

## 2017-04-10 LAB — VITAMIN B12: Vitamin B-12: 226 pg/mL — ABNORMAL LOW (ref 232–1245)

## 2017-04-10 LAB — CBC WITH DIFFERENTIAL/PLATELET
BASOS: 0 %
Basophils Absolute: 0 10*3/uL (ref 0.0–0.2)
EOS (ABSOLUTE): 0.4 10*3/uL (ref 0.0–0.4)
EOS: 6 %
Hematocrit: 42.5 % (ref 34.0–46.6)
Hemoglobin: 14.4 g/dL (ref 11.1–15.9)
IMMATURE GRANS (ABS): 0 10*3/uL (ref 0.0–0.1)
Immature Granulocytes: 0 %
LYMPHS: 36 %
Lymphocytes Absolute: 2.5 10*3/uL (ref 0.7–3.1)
MCH: 31.2 pg (ref 26.6–33.0)
MCHC: 33.9 g/dL (ref 31.5–35.7)
MCV: 92 fL (ref 79–97)
MONOS ABS: 0.5 10*3/uL (ref 0.1–0.9)
Monocytes: 8 %
NEUTROS ABS: 3.5 10*3/uL (ref 1.4–7.0)
Neutrophils: 50 %
PLATELETS: 238 10*3/uL (ref 150–379)
RBC: 4.62 x10E6/uL (ref 3.77–5.28)
RDW: 13.5 % (ref 12.3–15.4)
WBC: 7 10*3/uL (ref 3.4–10.8)

## 2017-04-10 LAB — TSH: TSH: 4.84 u[IU]/mL — ABNORMAL HIGH (ref 0.450–4.500)

## 2017-04-10 LAB — FOLATE: FOLATE: 9.8 ng/mL (ref >3.0)

## 2017-04-10 LAB — C-REACTIVE PROTEIN: CRP: 1.2 mg/L (ref 0.0–4.9)

## 2017-04-10 LAB — SEDIMENTATION RATE: Sed Rate: 16 mm/hr (ref 0–32)

## 2017-04-19 LAB — METHYLMALONIC ACID, SERUM: METHYLMALONIC ACID: 290 nmol/L (ref 0–378)

## 2017-04-19 LAB — SPECIMEN STATUS REPORT

## 2017-04-19 LAB — CK: CK TOTAL: 55 U/L (ref 24–173)

## 2017-04-21 ENCOUNTER — Telehealth: Payer: Self-pay | Admitting: Pediatrics

## 2017-04-22 ENCOUNTER — Other Ambulatory Visit: Payer: Self-pay | Admitting: Pediatrics

## 2017-04-22 DIAGNOSIS — E039 Hypothyroidism, unspecified: Secondary | ICD-10-CM

## 2017-04-22 MED ORDER — LEVOTHYROXINE SODIUM 50 MCG PO TABS: 50.0000 ug | ORAL_TABLET | Freq: Every day | ORAL | 3 refills | Status: DC

## 2017-04-22 NOTE — Telephone Encounter (Signed)
[facility] has lmrc for pt to contact their office for scheduling

## 2017-04-27 ENCOUNTER — Other Ambulatory Visit: Payer: Self-pay

## 2017-04-27 DIAGNOSIS — E538 Deficiency of other specified B group vitamins: Secondary | ICD-10-CM

## 2017-04-27 MED ORDER — CYANOCOBALAMIN 1000 MCG/ML IJ SOLN
1000.0000 ug | INTRAMUSCULAR | Status: DC
  Administered 2017-05-03 – 2018-01-13 (×4): 1000 ug via INTRAMUSCULAR

## 2017-05-03 ENCOUNTER — Encounter: Payer: Self-pay | Admitting: Pediatrics

## 2017-05-03 ENCOUNTER — Ambulatory Visit (INDEPENDENT_AMBULATORY_CARE_PROVIDER_SITE_OTHER): Payer: Medicaid Other | Admitting: Pediatrics

## 2017-05-03 VITALS — BP 122/77 | HR 81 | Temp 99.5°F | Ht 64.0 in | Wt 241.8 lb

## 2017-05-03 DIAGNOSIS — R7989 Other specified abnormal findings of blood chemistry: Secondary | ICD-10-CM | POA: Diagnosis not present

## 2017-05-03 DIAGNOSIS — F411 Generalized anxiety disorder: Secondary | ICD-10-CM

## 2017-05-03 DIAGNOSIS — I10 Essential (primary) hypertension: Secondary | ICD-10-CM

## 2017-05-03 DIAGNOSIS — R202 Paresthesia of skin: Secondary | ICD-10-CM

## 2017-05-03 DIAGNOSIS — Z720 Tobacco use: Secondary | ICD-10-CM | POA: Diagnosis not present

## 2017-05-03 DIAGNOSIS — R200 Anesthesia of skin: Secondary | ICD-10-CM | POA: Diagnosis not present

## 2017-05-03 MED ORDER — DULOXETINE HCL 60 MG PO CPEP: 60.0000 mg | ORAL_CAPSULE | Freq: Every day | ORAL | 2 refills | Status: DC

## 2017-05-03 NOTE — Progress Notes (Signed)
  Subjective:   Patient ID: Mary Riggs, female    DOB: December 02, 1975, 41 y.o.   MRN: [id number] CC: Follow-up multiple med problems HPI: Mary Riggs is a 41 y.o. female presenting for Follow-up  Pain in elbows shoulders ongoing Joints in arms bothering her most Knees hurt some as well Tingling ongoing both in hands and feet, sitting standing doesn't matter, daytime nighttime doesn't matter Feels sore constantly throughout muscles  Depression:  When asked if she has htoughts about not wanting to be here she says she has two kids who need her and she would never do anything Says anxiety stays there, maybe slightly better since starting cymbalta 1 mo ago, on 40mg  for past 3 weeks  Off of tea, was drinking 2 cups of sugar a day in her tea (drinking a gallon a day)  A little over 1 ppd cigarettes Smoking weed on weekends, not every day  Relevant past medical, surgical, family and social history reviewed. Allergies and medications reviewed and updated. History  Smoking Status  . Current Every Day Smoker  . Packs/day: 2.00  . Types: Cigarettes  Smokeless Tobacco  . Never Used   ROS: Per HPI   Objective:    BP 122/77   Pulse 81   Temp 99.5 F (37.5 C) (Oral)   Ht 5\' 4"  (1.626 m)   Wt 241 lb 12.8 oz (109.7 kg)   BMI 41.50 kg/m   Wt Readings from Last 3 Encounters:  05/03/17 241 lb 12.8 oz (109.7 kg)  04/09/17 240 lb 6.4 oz (109 kg)  10/28/16 234 lb (106.1 kg)   Gen: NAD, alert, cooperative with exam, NCAT EYES: EOMI, no conjunctival injection, or no icterus ENT:  TMs pearly gray b/l, OP without erythema LYMPH: no cervical LAD CV: NRRR, normal S1/S2, no murmur, distal pulses 2+ b/l Resp: CTABL, no wheezes, normal WOB Abd: +BS, soft, NTND. no guarding or organomegaly Ext: No edema, warm Neuro: Alert and oriented Psych: affect full, improved from last visit, denies thoughts of self harm  Assessment & Plan:  Sahej was seen today for follow-up multiple med  problems  Diagnoses and all orders for this visit:  Essential hypertension Stable, cont med  Generalized anxiety disorder Ongoing symptoms though improved from last visit, increase cymbalta to 60mg  daily -     DULoxetine (CYMBALTA) 60 MG capsule; Take 1 capsule (60 mg total) by mouth daily.  Numbness and tingling of upper and lower extremities of both sides Ongoing symptoms Start B12 replacement with low level, though with normal MMA not clearly cause of symptoms Started levothyroxine   Tobacco abuse Decreasing amount, not ready to quit  Follow up plan: Return in about 2 months (around 07/03/2017). Rex Krasarol Makael Stein, MD [facility]

## 2017-06-04 ENCOUNTER — Ambulatory Visit (INDEPENDENT_AMBULATORY_CARE_PROVIDER_SITE_OTHER): Payer: Medicaid Other | Admitting: *Deleted

## 2017-06-04 DIAGNOSIS — E538 Deficiency of other specified B group vitamins: Secondary | ICD-10-CM

## 2017-06-04 NOTE — Progress Notes (Signed)
Pt given Cyanocobalamin 1000mcg inj Tolerated well 

## 2017-07-17 ENCOUNTER — Other Ambulatory Visit: Payer: Self-pay | Admitting: Pediatrics

## 2017-07-17 DIAGNOSIS — I10 Essential (primary) hypertension: Secondary | ICD-10-CM

## 2017-08-05 ENCOUNTER — Encounter: Payer: Self-pay | Admitting: Pediatrics

## 2017-08-05 ENCOUNTER — Ambulatory Visit (INDEPENDENT_AMBULATORY_CARE_PROVIDER_SITE_OTHER): Payer: Medicaid Other | Admitting: Pediatrics

## 2017-08-05 DIAGNOSIS — E039 Hypothyroidism, unspecified: Secondary | ICD-10-CM

## 2017-08-05 DIAGNOSIS — I10 Essential (primary) hypertension: Secondary | ICD-10-CM | POA: Diagnosis not present

## 2017-08-05 DIAGNOSIS — D513 Other dietary vitamin B12 deficiency anemia: Secondary | ICD-10-CM | POA: Diagnosis not present

## 2017-08-05 DIAGNOSIS — R202 Paresthesia of skin: Secondary | ICD-10-CM

## 2017-08-05 DIAGNOSIS — Z23 Encounter for immunization: Secondary | ICD-10-CM | POA: Diagnosis not present

## 2017-08-05 DIAGNOSIS — R7989 Other specified abnormal findings of blood chemistry: Secondary | ICD-10-CM

## 2017-08-05 DIAGNOSIS — R200 Anesthesia of skin: Secondary | ICD-10-CM

## 2017-08-05 DIAGNOSIS — F411 Generalized anxiety disorder: Secondary | ICD-10-CM

## 2017-08-05 MED ORDER — LEVOTHYROXINE SODIUM 50 MCG PO TABS: 50.0000 ug | ORAL_TABLET | Freq: Every day | ORAL | 3 refills | Status: DC

## 2017-08-05 MED ORDER — HYDROCHLOROTHIAZIDE 25 MG PO TABS: 25.0000 mg | ORAL_TABLET | Freq: Every day | ORAL | 0 refills | Status: DC

## 2017-08-05 MED ORDER — DULOXETINE HCL 60 MG PO CPEP: 60.0000 mg | ORAL_CAPSULE | Freq: Every day | ORAL | 2 refills | Status: DC

## 2017-08-05 MED ORDER — BUSPIRONE HCL 5 MG PO TABS: 5.0000 mg | ORAL_TABLET | Freq: Three times a day (TID) | ORAL | 1 refills | Status: DC

## 2017-08-05 NOTE — Progress Notes (Addendum)
Subjective:   Patient ID: Mary Riggs, female    DOB: [birth year], 41 y.o.   MRN: [id number] CC: Follow-up (3 month); Depression; and Anxiety  HPI: Mary Riggs is a 41 y.o. 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 Goes out once a week to get groceries with her mom Otherwise doesn't leave house  Periods have been very regular, was late 5-6 days this month  No fevers, appetite is ok Some loose stools, no change since cholecystectomy  Depression screen [facility] 2/9 08/05/2017 05/03/2017 04/09/2017 10/28/2016 09/25/2016  Decreased Interest Down, Depressed, Hopeless PHQ - 2 Score Altered sleeping Tired, decreased energy Change in appetite Feeling bad or failure about yourself  Trouble concentrating Moving slowly or fidgety/restless Suicidal thoughts 0 0 0 2 0  PHQ-9 Score Difficult doing work/chores Somewhat difficult Very difficult Very difficult Very difficult Very difficult   GAD 7 : Generalized Anxiety Score 08/05/2017 05/03/2017 04/09/2017 10/28/2016  Nervous, Anxious, on Edge Control/stop worrying Worry too much - different things Trouble relaxing Restless Easily annoyed or irritable Afraid - awful might happen Total GAD 7 Score Anxiety Difficulty Very difficult Very difficult Very difficult -      Relevant past medical, surgical, family and social history reviewed. Allergies and medications reviewed and updated. History  Smoking Status  . Current Every Day Smoker  .  Packs/day: 2.00  . Types: Cigarettes  Smokeless Tobacco  . Never Used   ROS: Per HPI   Objective:    BP 135/86   Pulse 83   Temp 98.5 F (36.9 C) (Oral)   Ht  (1.626 m)   Wt 235 lb 9.6 oz (106.9 kg)   BMI 40.44 kg/m   Wt Readings from Last 3 Encounters:  08/05/17 235 lb 9.6 oz (106.9 kg)  05/03/17 241 lb 12.8 oz (109.7 kg)  04/09/17 240 lb 6.4 oz (109 kg)    Gen: NAD, alert, cooperative with exam, NCAT EYES: EOMI, no conjunctival injection, or no icterus ENT: OP without erythema LYMPH: no cervical LAD CV: NRRR, normal S1/S2, no murmur, distal pulses 2+ b/l Resp: CTABL, no wheezes, normal WOB Abd: +BS, soft, NTND. no guarding or organomegaly Ext: No edema, warm Neuro: Alert and oriented  Assessment & Plan:  [name] was seen today for follow-up, depression and anxiety.  Diagnoses and all orders for this visit:  Generalized anxiety disorder Ongoing symptoms Needs to see counseling [facility] for [facility] Discussed referral to [facility], pt wants to wait -     DULoxetine (CYMBALTA) 60 MG capsule; Take 1 capsule (60 mg total) by  mouth daily. -     Ambulatory referral to Psychiatry -     busPIRone (BUSPAR) 5 MG tablet; Take 1 tablet (5 mg total) by mouth 3 (three) times daily.  Numbness and tingling of upper and lower extremities of both sides -     DULoxetine (CYMBALTA) 60 MG capsule; Take 1 capsule (60 mg total) by mouth daily.  Essential hypertension Stable, cont current meds -     hydrochlorothiazide (HYDRODIURIL) 25 MG tablet; Take 1 tablet (25 mg total) by mouth daily.  Hypothyroidism, unspecified type Recheck TSH -     levothyroxine (SYNTHROID, LEVOTHROID) 50 MCG tablet; Take 1 tablet (50 mcg total) by mouth daily. -     TSH  Follow up plan: Return in about 6 weeks (around 09/16/2017). for pap smear Rex Kras, MD [place] [facility]

## 2017-08-05 NOTE — Patient Instructions (Signed)
[facility]  [address] [place], [place] [address] (ph) [phone]  [address], [address] [place], [place] [address] (ph) [phone]  

## 2017-08-06 LAB — TSH: TSH: 3.99 u[IU]/mL (ref 0.450–4.500)

## 2017-09-21 ENCOUNTER — Ambulatory Visit (HOSPITAL_COMMUNITY): Payer: Self-pay | Admitting: Psychiatry

## 2017-10-05 ENCOUNTER — Other Ambulatory Visit: Payer: Self-pay | Admitting: Pediatrics

## 2017-10-05 DIAGNOSIS — F411 Generalized anxiety disorder: Secondary | ICD-10-CM

## 2017-11-01 IMAGING — DX DG SHOULDER 2+V*R*
3 series · 3 of 3 positions shown · non-contrast
Comparison: None.

CLINICAL DATA: Right shoulder pain

EXAM:
RIGHT SHOULDER - 2+ VIEW

[shoulder ap]
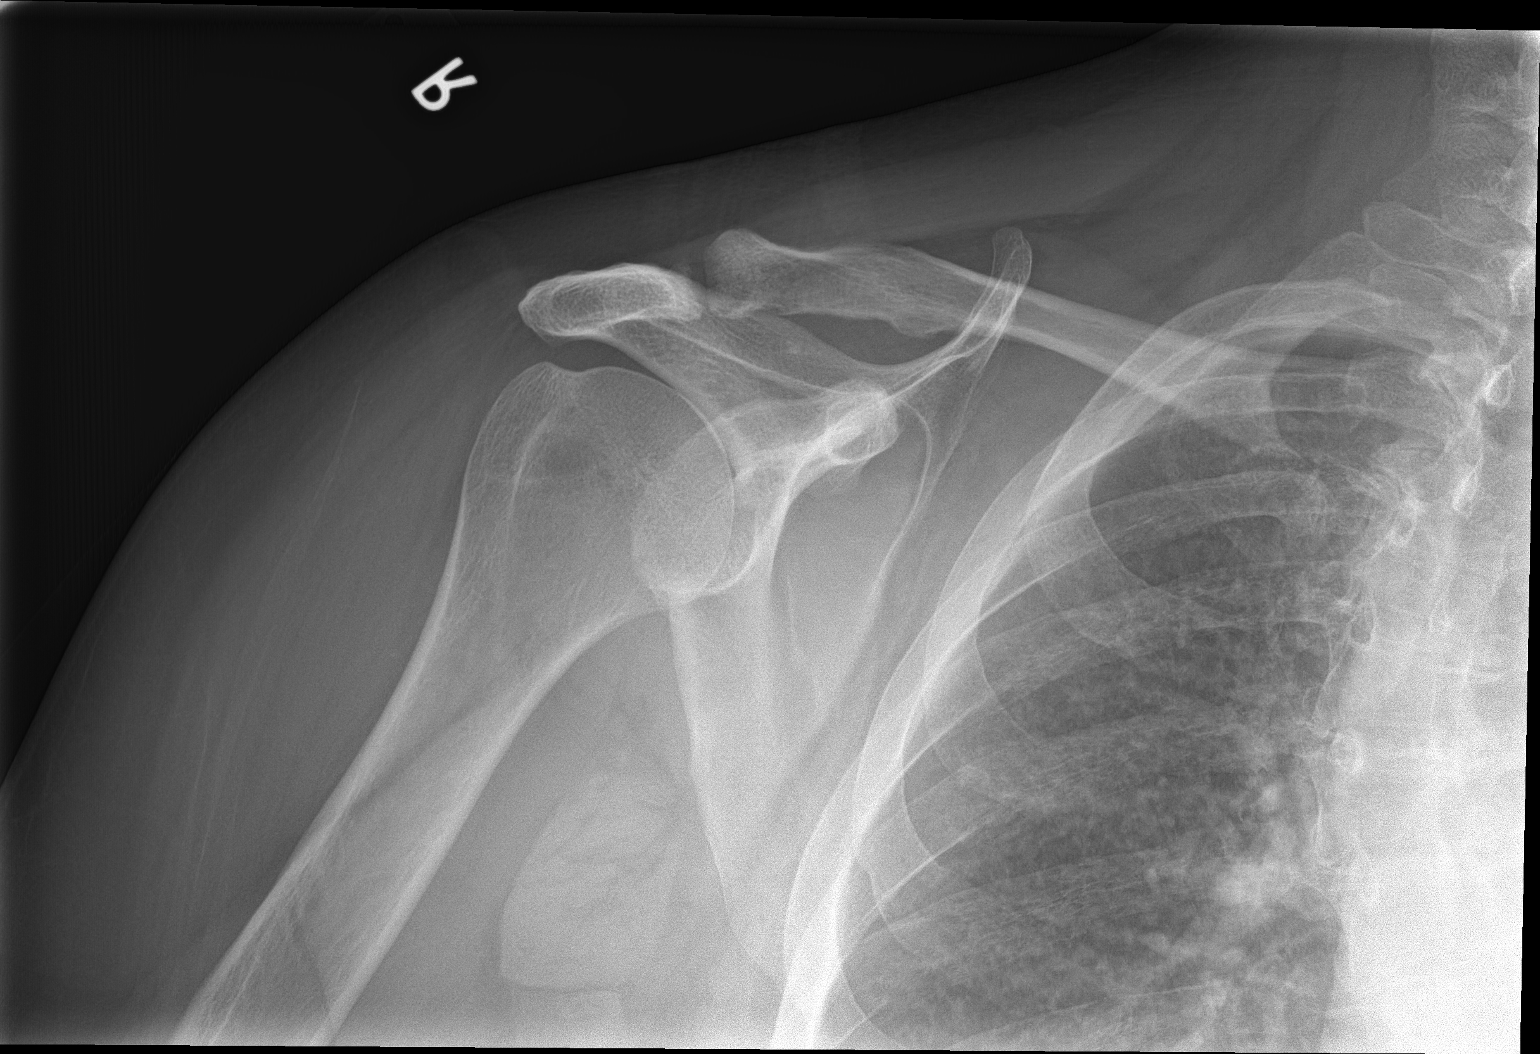

[shoulder obl]
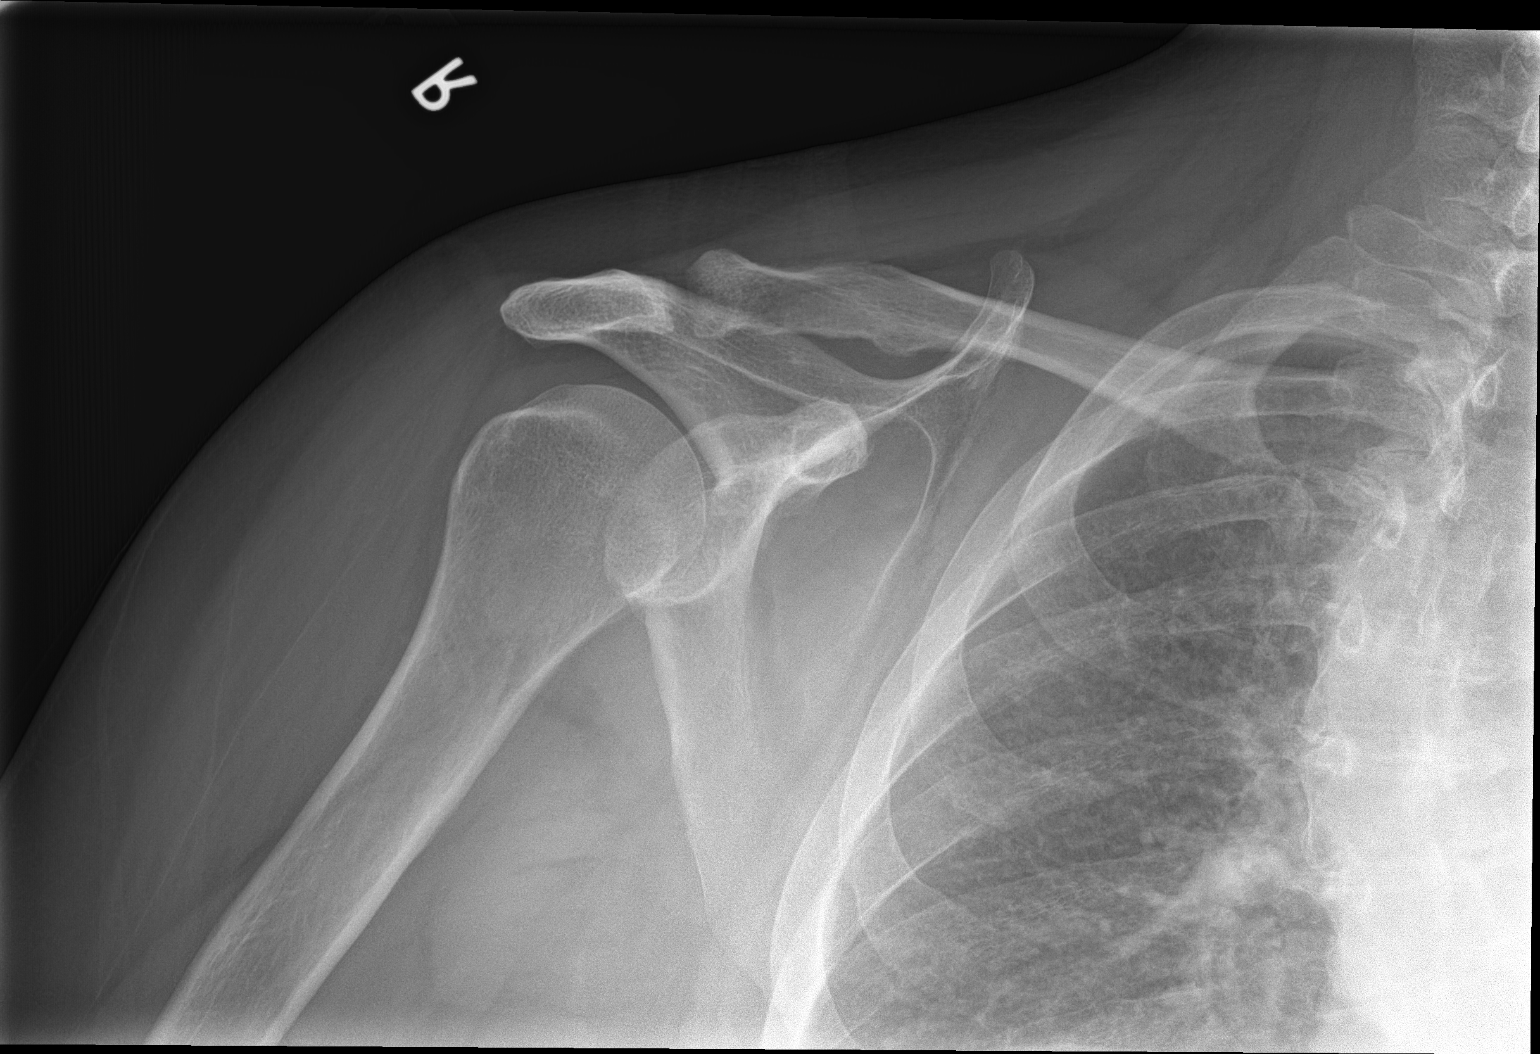

[shoulder axial]
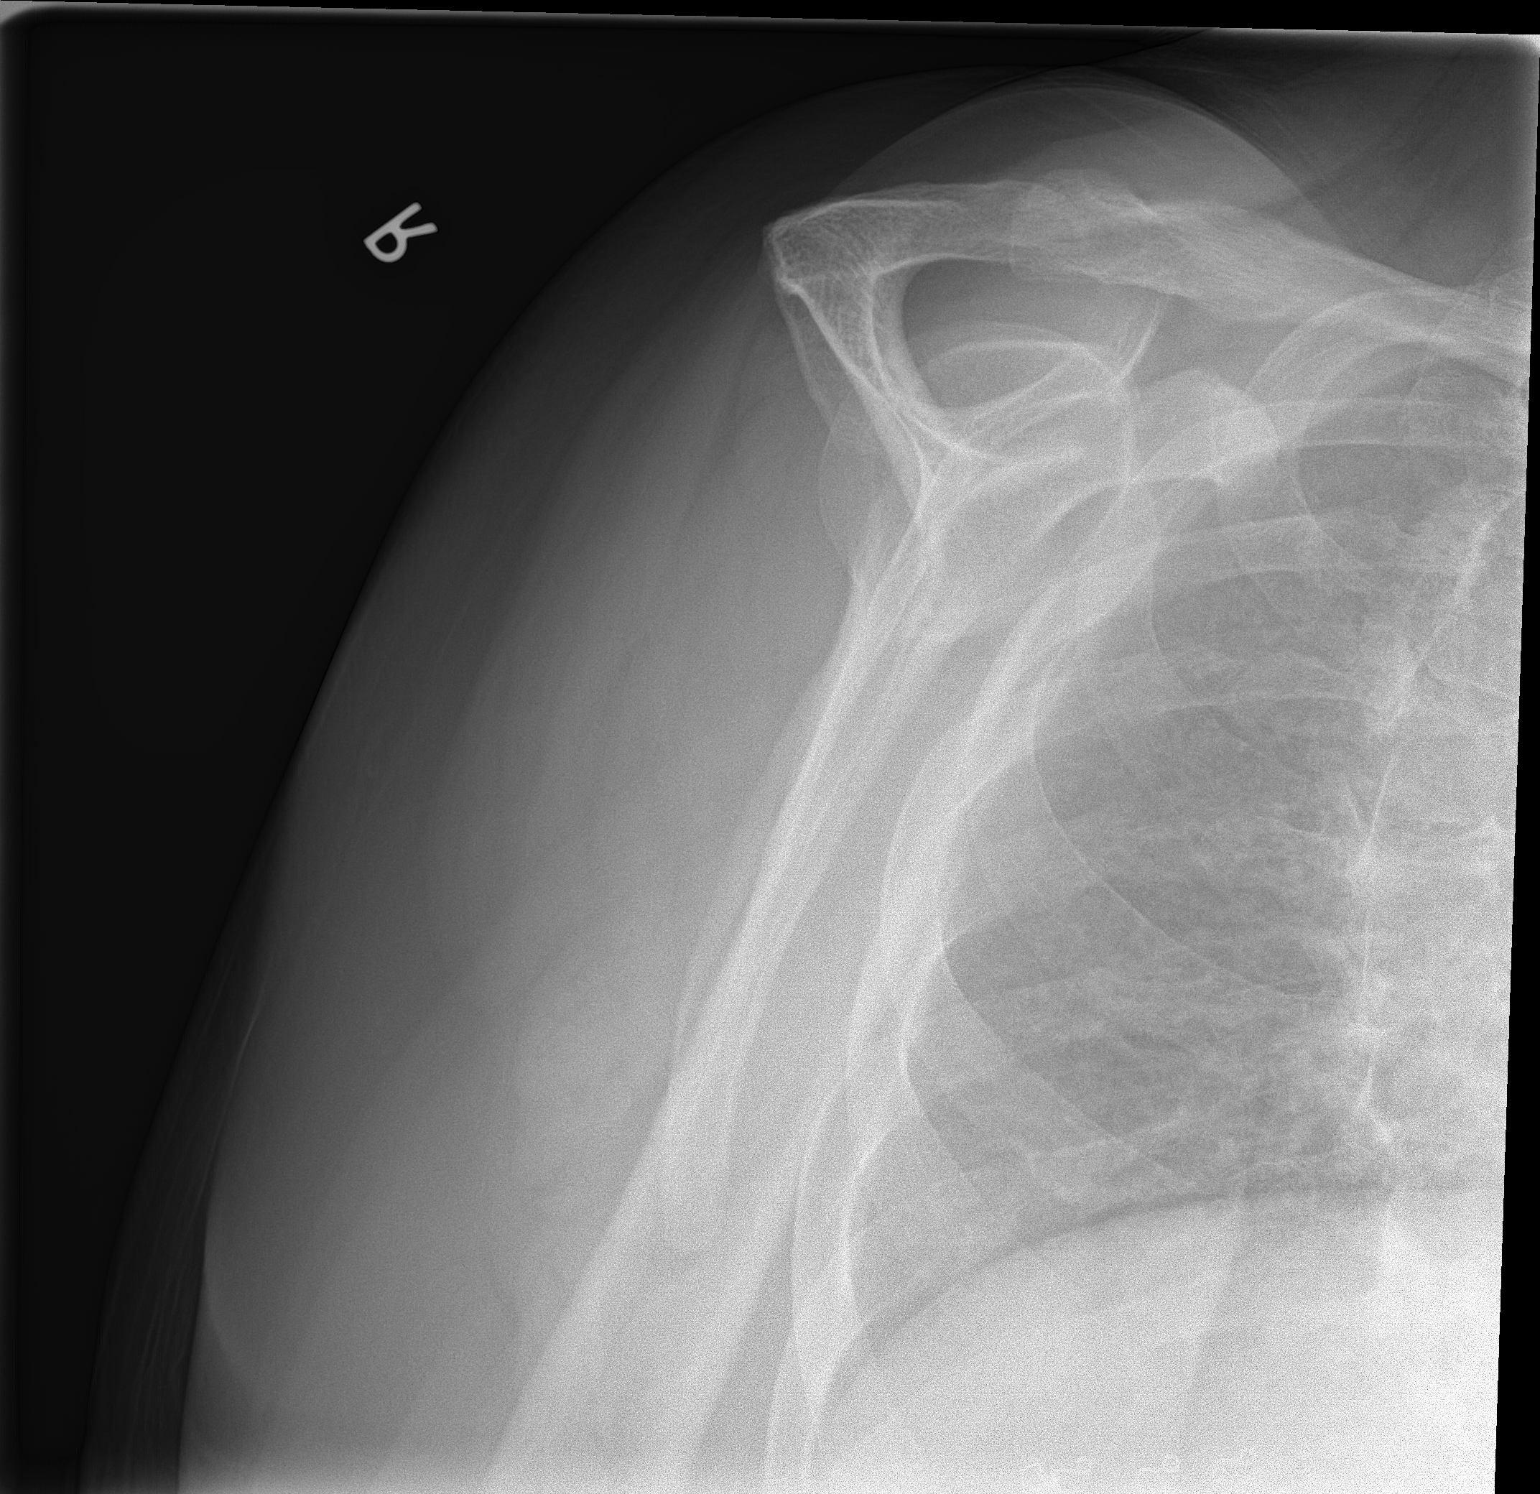

[3 of 3 positions shown; findings below may reference images not displayed]

FINDINGS: No acute fracture. No dislocation. Unremarkable soft tissues. Mild
degenerative change of the AC joint.
IMPRESSION: No acute bony pathology.  Mild degenerative change.

## 2017-11-08 ENCOUNTER — Other Ambulatory Visit: Payer: Self-pay | Admitting: Pediatrics

## 2017-11-08 DIAGNOSIS — F411 Generalized anxiety disorder: Secondary | ICD-10-CM

## 2017-11-23 ENCOUNTER — Other Ambulatory Visit: Payer: Self-pay | Admitting: Pediatrics

## 2017-11-23 DIAGNOSIS — R200 Anesthesia of skin: Secondary | ICD-10-CM

## 2017-11-23 DIAGNOSIS — R202 Paresthesia of skin: Secondary | ICD-10-CM

## 2017-11-23 DIAGNOSIS — E039 Hypothyroidism, unspecified: Secondary | ICD-10-CM

## 2017-11-23 DIAGNOSIS — F411 Generalized anxiety disorder: Secondary | ICD-10-CM

## 2017-11-24 NOTE — Telephone Encounter (Signed)
Patient was last seen on 9/20. Was to return in 6 weeks for follow up but did not. Please advise on refills

## 2017-12-30 ENCOUNTER — Encounter: Payer: Self-pay | Admitting: Pediatrics

## 2017-12-30 ENCOUNTER — Ambulatory Visit: Payer: Medicaid Other | Admitting: Pediatrics

## 2017-12-30 VITALS — BP 159/108 | HR 84 | Temp 97.3°F | Ht 64.0 in | Wt 257.0 lb

## 2017-12-30 DIAGNOSIS — I10 Essential (primary) hypertension: Secondary | ICD-10-CM | POA: Diagnosis not present

## 2017-12-30 DIAGNOSIS — R202 Paresthesia of skin: Secondary | ICD-10-CM

## 2017-12-30 DIAGNOSIS — G629 Polyneuropathy, unspecified: Secondary | ICD-10-CM

## 2017-12-30 DIAGNOSIS — E1140 Type 2 diabetes mellitus with diabetic neuropathy, unspecified: Secondary | ICD-10-CM

## 2017-12-30 DIAGNOSIS — E039 Hypothyroidism, unspecified: Secondary | ICD-10-CM

## 2017-12-30 DIAGNOSIS — R200 Anesthesia of skin: Secondary | ICD-10-CM | POA: Diagnosis not present

## 2017-12-30 DIAGNOSIS — F411 Generalized anxiety disorder: Secondary | ICD-10-CM

## 2017-12-30 LAB — BAYER DCA HB A1C WAIVED: HB A1C (BAYER DCA - WAIVED): 8.3 % — ABNORMAL HIGH (ref <7.0)

## 2017-12-30 MED ORDER — LEVOTHYROXINE SODIUM 50 MCG PO TABS: 50.0000 ug | ORAL_TABLET | Freq: Every day | ORAL | 1 refills | Status: DC

## 2017-12-30 MED ORDER — BUSPIRONE HCL 10 MG PO TABS: ORAL_TABLET | ORAL | 1 refills | Status: DC

## 2017-12-30 MED ORDER — DULOXETINE HCL 60 MG PO CPEP: ORAL_CAPSULE | ORAL | 1 refills | Status: DC

## 2017-12-30 MED ORDER — HYDROCHLOROTHIAZIDE 25 MG PO TABS: 25.0000 mg | ORAL_TABLET | Freq: Every day | ORAL | 0 refills | Status: DC

## 2017-12-30 MED ORDER — METFORMIN HCL 500 MG PO TABS: 500.0000 mg | ORAL_TABLET | Freq: Two times a day (BID) | ORAL | 3 refills | Status: DC

## 2017-12-30 NOTE — Progress Notes (Signed)
Subjective:   Patient ID: Mary Riggs, female    DOB: Feb 28, 1976, 42 y.o.   MRN: [id number] CC: follow up med problems HPI: Mary Riggs is a 42 y.o. female presenting for follow up med problems Anxiety and agoraphobia: leaves house once a week. Goes to grocery, tobacco shop then comes home. Getting worse, only goes out when she absolutely has to. Daughter gets picked up and dropped off at the bus stop near house. Daughter's father lives in [place], takes daughter to other appts when daughter needs.    Was taking medicines regularly, stopped three weeks ago when she found out she is losing insurance.  HTN: No chest pain, SOB. Not taking meds as above.  Has gained weight since last visit. Drinks sweet tea daily.  Has numbness and tingling in her hands and feet that has been ongoing. Has low B12 levels, says she was getting B12 shots regularly, only one since 04/2017 documented.   Relevant past medical, surgical, family and social history reviewed. Allergies and medications reviewed and updated. Social History   Tobacco Use  Smoking Status Current Every Day Smoker  . Packs/day: 2.00  . Types: Cigarettes  Smokeless Tobacco Never Used   ROS: Per HPI   Objective:    BP (!) 159/108   Pulse 84   Temp (!) 97.3 F (36.3 C) (Oral)   Ht 5' 4"  (1.626 m)   Wt 257 lb (116.6 kg)   BMI 44.11 kg/m   Wt Readings from Last 3 Encounters:  12/30/17 257 lb (116.6 kg)  08/05/17 235 lb 9.6 oz (106.9 kg)  05/03/17 241 lb 12.8 oz (109.7 kg)    Gen: NAD, alert, cooperative with exam, NCAT EYES: EOMI, no conjunctival injection, or no icterus ENT:  TMs pearly gray b/l, OP without erythema LYMPH: no cervical LAD CV: NRRR, normal S1/S2, no murmur Resp: CTABL, no wheezes, normal WOB Abd: +BS, soft, NTND. no guarding or organomegaly Ext: No edema, warm Neuro: Alert and oriented, strength equal b/l UE and LE, coordination grossly normal Psych: full affect, frustrated over loss of  insurance, no thoughts of self harm  Assessment & Plan:  [name] was seen today for follow up med problems. Recently stopped all medicines in anticipation of losing insurance. Discussed all of her medicines should be $4 to $9 a month at [facility]. She thinks she will be able to afford that. Will send in 90 days. Will follow up in 1.5 weeks for repeat BP and annual well visit.  Diagnoses and all orders for this visit:  Generalized anxiety disorder Ongoing symptoms, trouble leaving house. Needs to be in counseling. Pt declines for now.  -     DULoxetine (CYMBALTA) 60 MG capsule; TAKE 1 CAPSULE BY MOUTH EVERY DAY -     busPIRone (BUSPAR) 10 MG tablet; TAKE 1/2 OF A TABLET (5 MG) BY MOUTH THREE TIMES A DAY  Numbness and tingling of upper and lower extremities of both sides Neuropathy Off of thyroid medicine recently, new dx of DM2, B12 deficiency that is not treated. Will check labs, refer to neurology, get started back on medicines. -     TSH -     Bayer DCA Hb A1c Waived -     Vitamin B12 -     CBC with Differential/Platelet -     Folate -     Methylmalonic Acid, Serum -     Ambulatory referral to Neurology  Essential hypertension Elevated. Pt upset and not on medication,  restart.  -     CMP14+EGFR -     hydrochlorothiazide (HYDRODIURIL) 25 MG tablet; Take 1 tablet (25 mg total) by mouth daily.  Hypothyroidism, unspecified type Restart below, missed 3 weeks of medicine. -     levothyroxine (SYNTHROID, LEVOTHROID) 50 MCG tablet; Take 1 tablet (50 mcg total) by mouth daily.  Type 2 diabetes mellitus with diabetic neuropathy, without long-term current use of insulin (HCC) New diganosis. Drinking multiple glasses sweet tea daily. Pt agrees to start decreasing. Start below -     metFORMIN (GLUCOPHAGE) 500 MG tablet; Take 1 tablet (500 mg total) by mouth 2 (two) times daily with a meal.   Follow up plan: Return in about 2 weeks (around 01/13/2018). Assunta Found, MD [place]

## 2018-01-01 LAB — CBC WITH DIFFERENTIAL/PLATELET
Basophils Absolute: 0.1 10*3/uL (ref 0.0–0.2)
Basos: 1 %
EOS (ABSOLUTE): 0.4 10*3/uL (ref 0.0–0.4)
EOS: 5 %
HEMOGLOBIN: 15 g/dL (ref 11.1–15.9)
Hematocrit: 43.4 % (ref 34.0–46.6)
IMMATURE GRANS (ABS): 0 10*3/uL (ref 0.0–0.1)
Immature Granulocytes: 0 %
LYMPHS ABS: 3.4 10*3/uL — AB (ref 0.7–3.1)
LYMPHS: 41 %
MCH: 30.9 pg (ref 26.6–33.0)
MCHC: 34.6 g/dL (ref 31.5–35.7)
MCV: 89 fL (ref 79–97)
MONOCYTES: 5 %
Monocytes Absolute: 0.4 10*3/uL (ref 0.1–0.9)
Neutrophils Absolute: 3.8 10*3/uL (ref 1.4–7.0)
Neutrophils: 48 %
Platelets: 288 10*3/uL (ref 150–379)
RBC: 4.86 x10E6/uL (ref 3.77–5.28)
RDW: 14.4 % (ref 12.3–15.4)
WBC: 8.1 10*3/uL (ref 3.4–10.8)

## 2018-01-01 LAB — CMP14+EGFR
A/G RATIO: 1.1 — AB (ref 1.2–2.2)
ALT: 27 IU/L (ref 0–32)
AST: 27 IU/L (ref 0–40)
Albumin: 3.9 g/dL (ref 3.5–5.5)
Alkaline Phosphatase: 87 IU/L (ref 39–117)
BILIRUBIN TOTAL: 0.3 mg/dL (ref 0.0–1.2)
BUN/Creatinine Ratio: 5 — ABNORMAL LOW (ref 9–23)
BUN: 4 mg/dL — ABNORMAL LOW (ref 6–24)
CHLORIDE: 103 mmol/L (ref 96–106)
CO2: 19 mmol/L — ABNORMAL LOW (ref 20–29)
Calcium: 9.1 mg/dL (ref 8.7–10.2)
Creatinine, Ser: 0.82 mg/dL (ref 0.57–1.00)
GFR calc non Af Amer: 89 mL/min/{1.73_m2} (ref >59)
GFR, EST AFRICAN AMERICAN: 103 mL/min/{1.73_m2} (ref >59)
GLOBULIN, TOTAL: 3.4 g/dL (ref 1.5–4.5)
Glucose: 163 mg/dL — ABNORMAL HIGH (ref 65–99)
POTASSIUM: 4.2 mmol/L (ref 3.5–5.2)
Sodium: 139 mmol/L (ref 134–144)
TOTAL PROTEIN: 7.3 g/dL (ref 6.0–8.5)

## 2018-01-01 LAB — TSH: TSH: 7.06 u[IU]/mL — ABNORMAL HIGH (ref 0.450–4.500)

## 2018-01-01 LAB — FOLATE: Folate: 7.7 ng/mL (ref >3.0)

## 2018-01-01 LAB — METHYLMALONIC ACID, SERUM: METHYLMALONIC ACID: 163 nmol/L (ref 0–378)

## 2018-01-01 LAB — VITAMIN B12: Vitamin B-12: 219 pg/mL — ABNORMAL LOW (ref 232–1245)

## 2018-01-05 LAB — HM DIABETES EYE EXAM

## 2018-01-10 ENCOUNTER — Ambulatory Visit: Payer: Medicaid Other | Admitting: Pediatrics

## 2018-01-13 ENCOUNTER — Ambulatory Visit (INDEPENDENT_AMBULATORY_CARE_PROVIDER_SITE_OTHER): Payer: Medicaid Other | Admitting: Pediatrics

## 2018-01-13 ENCOUNTER — Encounter: Payer: Self-pay | Admitting: Pediatrics

## 2018-01-13 ENCOUNTER — Telehealth: Payer: Self-pay | Admitting: Pediatrics

## 2018-01-13 VITALS — BP 166/101 | HR 98 | Temp 99.4°F | Ht 64.0 in | Wt 257.0 lb

## 2018-01-13 DIAGNOSIS — E039 Hypothyroidism, unspecified: Secondary | ICD-10-CM

## 2018-01-13 DIAGNOSIS — L299 Pruritus, unspecified: Secondary | ICD-10-CM

## 2018-01-13 DIAGNOSIS — E1140 Type 2 diabetes mellitus with diabetic neuropathy, unspecified: Secondary | ICD-10-CM

## 2018-01-13 DIAGNOSIS — E538 Deficiency of other specified B group vitamins: Secondary | ICD-10-CM

## 2018-01-13 DIAGNOSIS — F411 Generalized anxiety disorder: Secondary | ICD-10-CM

## 2018-01-13 DIAGNOSIS — R200 Anesthesia of skin: Secondary | ICD-10-CM

## 2018-01-13 DIAGNOSIS — R202 Paresthesia of skin: Secondary | ICD-10-CM

## 2018-01-13 DIAGNOSIS — Z0000 Encounter for general adult medical examination without abnormal findings: Secondary | ICD-10-CM | POA: Diagnosis not present

## 2018-01-13 DIAGNOSIS — I10 Essential (primary) hypertension: Secondary | ICD-10-CM

## 2018-01-13 LAB — WET PREP FOR TRICH, YEAST, CLUE
Clue Cell Exam: NEGATIVE
TRICHOMONAS EXAM: NEGATIVE
Yeast Exam: NEGATIVE

## 2018-01-13 MED ORDER — HYDROCORTISONE 1 % EX OINT: 1.0000 "application " | TOPICAL_OINTMENT | Freq: Two times a day (BID) | CUTANEOUS | 1 refills | Status: AC

## 2018-01-13 MED ORDER — DULOXETINE HCL 20 MG PO CPEP: ORAL_CAPSULE | ORAL | 1 refills | Status: DC

## 2018-01-13 MED ORDER — LEVOTHYROXINE SODIUM 50 MCG PO TABS: 50.0000 ug | ORAL_TABLET | Freq: Every day | ORAL | 1 refills | Status: DC

## 2018-01-13 MED ORDER — DULOXETINE HCL 60 MG PO CPEP: ORAL_CAPSULE | ORAL | 1 refills | Status: DC

## 2018-01-13 MED ORDER — HYDROCORTISONE 1 % EX OINT: 1.0000 "application " | TOPICAL_OINTMENT | Freq: Two times a day (BID) | CUTANEOUS | 0 refills | Status: DC

## 2018-01-13 MED ORDER — METFORMIN HCL 500 MG PO TABS: 500.0000 mg | ORAL_TABLET | Freq: Two times a day (BID) | ORAL | 3 refills | Status: DC

## 2018-01-13 MED ORDER — CYANOCOBALAMIN 1000 MCG/ML IJ SOLN: 1000.0000 ug | INTRAMUSCULAR | 1 refills | Status: DC

## 2018-01-13 MED ORDER — HYDROCHLOROTHIAZIDE 25 MG PO TABS: 25.0000 mg | ORAL_TABLET | Freq: Every day | ORAL | 0 refills | Status: DC

## 2018-01-13 MED ORDER — BUSPIRONE HCL 10 MG PO TABS: ORAL_TABLET | ORAL | 1 refills | Status: DC

## 2018-01-13 NOTE — Telephone Encounter (Signed)
done

## 2018-01-13 NOTE — Progress Notes (Signed)
Subjective:   Patient ID: Mary Riggs, female    DOB: 25-Aug-1976, 42 y.o.   MRN: [id number] CC: Annual Exam and Gynecologic Exam  HPI: Mary Riggs is a 42 y.o. female presenting for Annual Exam and Gynecologic Exam  Has not yet picked up her medicines her blood pressure or anxiety.  Planning to do it today.  Mood has been okay.  Has decreased how much sugar she is taking in such as with sweet tea.  She would like screening for STI's.  No recent new sexual partners.  Not currently sexually active.  Having regular periods.   Has a sore area inside of her left breast she has noticed over the last few weeks.  No rash, no redness.  [facility] 01/13/2018 12/30/2017 08/05/2017 05/03/2017 04/09/2017  Decreased Interest 3 0 3 3 3   Down, Depressed, Hopeless 3 0 3 3 3   PHQ - 2 Score 6 0 6 6 6   Altered sleeping 3 - 3 2 3   Tired, decreased energy 3 - 3 2 3   Change in appetite 3 - 3 2 3   Feeling bad or failure about yourself  3 - 3 1 2   Trouble concentrating 3 - 3 2 3   Moving slowly or fidgety/restless 3 - 3 2 3   Suicidal thoughts 1 - 0 0 0  PHQ-9 Score 25 - 24 17 23   Difficult doing work/chores Very difficult - Somewhat difficult Very difficult Very difficult     Relevant past medical, surgical, family and social history reviewed. Allergies and medications reviewed and updated. Social History   Tobacco Use  Smoking Status Current Every Day Smoker  . Packs/day: 2.00  . Types: Cigarettes  Smokeless Tobacco Never Used   ROS: All systems negative other than what is in the HPI.  Objective:    BP (!) 166/101   Pulse 98   Temp 99.4 F (37.4 C) (Oral)   Ht 5\' 4"  (1.626 m)   Wt 257 lb (116.6 kg)   BMI 44.11 kg/m   Wt Readings from Last 3 Encounters:  01/13/18 257 lb (116.6 kg)  12/30/17 257 lb (116.6 kg)  08/05/17 235 lb 9.6 oz (106.9 kg)    Gen: NAD, alert, cooperative with exam, NCAT EYES: EOMI, no conjunctival injection, or no icterus ENT:  TMs pearly gray  b/l, OP without erythema LYMPH: no cervical LAD CV: NRRR, normal S1/S2, no murmur, distal pulses 2+ b/l Resp: CTABL, no wheezes, normal WOB Abd: +BS, soft, NTND. no guarding or organomegaly Ext: No edema, warm Neuro: Alert and oriented, strength equal b/l UE and LE, coordination grossly normal MSK: normal muscle bulk Breast: Tender to palpation left lateral side of breast.  No discrete masses palpated.  No redness.  No induration.  Normal right breast exam. Normal external female genitaliaGU:, Normal-appearing cervix.  Small amount white discharge present in the vaginal vault Psych: Normal affect.  Denies thoughts of self-harm at home. + Occasional passive SI  Assessment & Plan:  Verta was seen today for annual exam and gynecologic exam.  Diagnoses and all orders for this visit:  Encounter for preventive health examination Screening for STI's. Left breast diagnostic mammogram ordered due to pain and discomfort. -     HIV antibody -     RPR -     Pap IG, CT/NG NAA, and HPV (high risk) [facility] -     Hepatitis C antibody -     Hepatitis B surface antibody -  Hepatitis B surface antigen -     Hepatitis B core antibody, total -     MM Digital Diagnostic Unilat L; Future -     MM Digital Screening; Future -     WET PREP FOR TRICH, YEAST, CLUE  Generalized anxiety disorder Ongoing symptoms, continue below so a little bit but is at this point as the cholesteatoma on the outside.  He had -     busPIRone (BUSPAR) 10 MG tablet; TAKE 1/2 OF A TABLET (5 MG) BY MOUTH THREE TIMES A DAY -     DULoxetine (CYMBALTA) 60 MG capsule; TAKE 1 CAPSULE BY MOUTH EVERY DAY for 2 weeks, then 2 tabs for 2 weeks, then 3 tabs  Numbness and tingling of upper and lower extremities of both sides Restart below.  Start at 20 mg, increase to 40 mg in 2 weeks, then 60 mg 2 weeks after that. -     DULoxetine (CYMBALTA) 60 MG capsule; TAKE 1 CAPSULE BY MOUTH EVERY DAY for 2 weeks, then 2 tabs for 2 weeks,  then 3 tabs  Essential hypertension Elevated today.  Patient is not on any medication. Start below. rtc 2 weeks for BP recheck. -     hydrochlorothiazide (HYDRODIURIL) 25 MG tablet; Take 1 tablet (25 mg total) by mouth daily.  Hypothyroidism, unspecified type Start below. TSH drawn while on no thyroid medicine. -     levothyroxine (SYNTHROID, LEVOTHROID) 50 MCG tablet; Take 1 tablet (50 mcg total) by mouth daily.  Type 2 diabetes mellitus with diabetic neuropathy, without long-term current use of insulin (HCC) -     metFORMIN (GLUCOPHAGE) 500 MG tablet; Take 1 tablet (500 mg total) by mouth 2 (two) times daily with a meal. -     Microalbumin / creatinine urine ratio  B12 deficiency Start B12 injections -     cyanocobalamin (,VITAMIN B-12,) 1000 MCG/ML injection; Inject 1 mL (1,000 mcg total) into the muscle every 30 (thirty) days.  Itching -     hydrocortisone 1 % ointment; Apply 1 application topically 2 (two) times daily.   Follow up plan: Return in about 2 weeks (around 01/27/2018). for nurse visit for BP check. 3 mo to see me. Gave info for [employer] for counseling, health dept for f/u medical problems if pt does lose her insurance. Rex Kras, MD [place] [facility]

## 2018-01-13 NOTE — Patient Instructions (Signed)
[facility] [phone]) [phone] Health Department [phone]) [phone]

## 2018-01-14 LAB — MICROALBUMIN / CREATININE URINE RATIO
CREATININE, UR: 80.5 mg/dL
Microalb/Creat Ratio: <3.7 mg/g creat (ref 0.0–30.0)

## 2018-01-14 LAB — HEPATITIS C ANTIBODY

## 2018-01-14 LAB — RPR: RPR Ser Ql: NONREACTIVE

## 2018-01-14 LAB — HEPATITIS B CORE ANTIBODY, TOTAL: HEP B C TOTAL AB: NEGATIVE

## 2018-01-14 LAB — HIV ANTIBODY (ROUTINE TESTING W REFLEX): HIV Screen 4th Generation wRfx: NONREACTIVE

## 2018-01-14 LAB — HEPATITIS B SURFACE ANTIGEN: Hepatitis B Surface Ag: NEGATIVE

## 2018-01-14 LAB — HEPATITIS B SURFACE ANTIBODY,QUALITATIVE: HEP B SURFACE AB, QUAL: NONREACTIVE

## 2018-01-17 LAB — PAP IG, CT-NG NAA, HPV HIGH-RISK
Chlamydia, Nuc. Acid Amp: NEGATIVE
GONOCOCCUS BY NUCLEIC ACID AMP: NEGATIVE
HPV, high-risk: NEGATIVE
PAP Smear Comment: 0

## 2018-01-21 ENCOUNTER — Telehealth: Payer: Self-pay | Admitting: Pediatrics

## 2018-01-21 NOTE — Telephone Encounter (Signed)
Aware of lab results  

## 2023-01-01 DIAGNOSIS — I10 Essential (primary) hypertension: Secondary | ICD-10-CM | POA: Insufficient documentation

## 2023-09-16 DIAGNOSIS — R768 Other specified abnormal immunological findings in serum: Secondary | ICD-10-CM | POA: Insufficient documentation

## 2023-09-16 DIAGNOSIS — M797 Fibromyalgia: Secondary | ICD-10-CM | POA: Insufficient documentation

## 2023-09-16 DIAGNOSIS — G5603 Carpal tunnel syndrome, bilateral upper limbs: Secondary | ICD-10-CM | POA: Insufficient documentation

## 2024-03-22 NOTE — Progress Notes (Signed)
 New Patient Office Visit  Subjective   Patient ID: Mary Riggs, female    DOB: 1975-11-20  Age: 48 y.o. MRN: [id number]  CC:  Chief Complaint  Patient presents with   Establish Care    Says she has issues with her cerebellum and previous dr would not help.     HPI Mary Riggs 48 year old female present Mar 23, 2024  to establish care concern for medication refills and new onset of conjunctivitis Report history of MI February 2024.  Past medical history of hypertension, hypothyroidism diabetes anxiety, fibromyalgia.  Acire reports that she left a PCP because she not being heard requesting a referral to see cardiology due to her history of MI but was referred to rheumatology instead.  Mary Riggs has a history of positive ANA which could explain why she was referred to rheumatology  Conjunctivitis new problem 48 y.o. female with burning, redness, discharge and mattering in right eye for 2 days.  No other symptoms.  No significant prior ophthalmological history. No change in visual acuity, no photophobia, no severe eye pain.  Fibromyalgia 48 year old new patient presents for initial evaluation and reports a history of fibromyalgia. She states, "I was told that I have fibromyalgia", but no medical records from her previous primary care provider are available at this time to confirm the diagnosis. The patient reports that her chronic pain is currently well managed with pregabalin (Lyrica) 75 mg taken twice daily. She denies any recent exacerbations or need for dose adjustment and expresses satisfaction with her current pain control regimen.   HTN 48 year old new patient presents with a history of hypertension. At today's visit, her blood pressure is elevated at 174/90 mmHg. She reports that she has been without amlodipine  for over one month due to running out of the medication and has only been taking metoprolol 50 mg twice daily and losartan 50 mg daily. The patient denies associated  symptoms such as headache, chest pain, or blurry vision. Amlodipine  will be refilled today, and the patient will return in two weeks for a nurse visit and blood pressure recheck.  GAD/MDD 48 year old new patient with a history of Major Depressive Disorder and Generalized Anxiety Disorder presents for follow-up. She reports that she was previously prescribed fluoxetine  (Prozac ) and buspirone  (Buspar ), but has only been taking fluoxetine . She states, "I didn't think you can take both together." During today's encounter, she appears very tearful and reports worsening anxiety. She denies suicidal or homicidal ideation. Plan is to restart buspirone  to improve anxiety symptoms and monitor response closely.    03/23/2024    3:31 PM 08/05/2017    9:33 AM 05/03/2017   11:02 AM 04/09/2017    9:40 AM  GAD 7 : Generalized Anxiety Score  Nervous, Anxious, on Edge 1 3 2 3   Control/stop worrying 1 3 2 3   Worry too much - different things 1 3 2 3   Trouble relaxing 1 3 2 3   Restless 0 3 2 3   Easily annoyed or irritable 1 3 2 3   Afraid - awful might happen 1 3 2 3   Total GAD 7 Score 6 21 14 21   Anxiety Difficulty Very difficult Very difficult Very difficult Very difficult        03/23/2024    3:30 PM 01/13/2018   12:40 PM 12/30/2017   11:49 AM  PHQ9 SCORE ONLY  PHQ-9 Total Score 12 25 53     Mary Riggs 48 year old female with a history of type 2 diabetes mellitus presents for follow-up.  She is currently managed with semaglutide (Ozempic) 0.5 mg subcutaneously once weekly. Her most recent hemoglobin A1c is 7.9%. A foot exam and microalbumin screening were completed during today's visit.   Hypothyroidism 48 year old female with a history of hypothyroidism presents with concerns of hair loss and dry skin. She reports that her TSH has not been checked in over a year. The patient is currently taking levothyroxine  (Synthroid ) 25 mcg daily. Plan is to check TSH to assess thyroid  function and ensure euthyroid status prior  to refilling Synthroid . Smokes 1-pack daily  Outpatient Encounter Medications as of 03/23/2024  Medication Sig   atorvastatin  (LIPITOR) 10 MG tablet Take 1 tablet (10 mg total) by mouth daily.   b complex vitamins capsule Take 1 capsule by mouth daily.   busPIRone  (BUSPAR ) 7.5 MG tablet Take 1 tablet (7.5 mg total) by mouth 2 (two) times daily.   cholecalciferol (VITAMIN D3) 25 MCG (1000 UNIT) tablet Take 1,000 Units by mouth daily.   levothyroxine  (SYNTHROID ) 25 MCG tablet Take 25 mcg by mouth every morning.   losartan (COZAAR) 50 MG tablet Take 50 mg by mouth 2 (two) times daily.   metoprolol tartrate (LOPRESSOR) 50 MG tablet Take 50 mg by mouth 2 (two) times daily.   Omega-3 Fatty Acids (FISH OIL) 1000 MG CAPS Take by mouth.   OZEMPIC, 0.25 OR 0.5 MG/DOSE, 2 MG/3ML SOPN Inject 0.5 mg into the skin once a week.   pregabalin (LYRICA) 75 MG capsule Take 75 mg by mouth 2 (two) times daily.   trimethoprim -polymyxin b  (POLYTRIM ) ophthalmic solution Place 1 drop into the left eye every 4 (four) hours.   [DISCONTINUED] amLODipine  (NORVASC ) 5 MG tablet Take 1 tablet by mouth daily.   [DISCONTINUED] cyanocobalamin  (,VITAMIN B-12,) 1000 MCG/ML injection Inject 1 mL (1,000 mcg total) into the muscle every 30 (thirty) days.   [DISCONTINUED] FLUoxetine  (PROZAC ) 20 MG capsule Take 20 mg by mouth daily.   amLODipine  (NORVASC ) 5 MG tablet Take 1 tablet (5 mg total) by mouth daily.   FLUoxetine  (PROZAC ) 20 MG capsule Take 1 capsule (20 mg total) by mouth daily.   hydrocortisone  1 % ointment Apply 1 application topically 2 (two) times daily. (Patient not taking: Reported on 03/23/2024)   [DISCONTINUED] busPIRone  (BUSPAR ) 10 MG tablet TAKE 1/2 OF A TABLET (5 MG) BY MOUTH THREE TIMES A DAY (Patient not taking: Reported on 03/23/2024)   [DISCONTINUED] DULoxetine  (CYMBALTA ) 60 MG capsule TAKE 1 CAPSULE BY MOUTH EVERY DAY for 2 weeks, then 2 tabs for 2 weeks, then 3 tabs (Patient not taking: Reported on 03/23/2024)    [DISCONTINUED] hydrochlorothiazide  (HYDRODIURIL ) 25 MG tablet Take 1 tablet (25 mg total) by mouth daily. (Patient not taking: Reported on 03/23/2024)   [DISCONTINUED] levothyroxine  (SYNTHROID , LEVOTHROID) 50 MCG tablet Take 1 tablet (50 mcg total) by mouth daily. (Patient not taking: Reported on 03/23/2024)   [DISCONTINUED] metFORMIN  (GLUCOPHAGE ) 500 MG tablet Take 1 tablet (500 mg total) by mouth 2 (two) times daily with a meal. (Patient not taking: Reported on 03/23/2024)   [DISCONTINUED] cyanocobalamin  ((VITAMIN B-12)) injection 1,000 mcg    No facility-administered encounter medications on file as of 03/23/2024.    Past Medical History:  Diagnosis Date   Heart attack [facility])    Kidney stones    Type 2 diabetes mellitus (HCC)     Past Surgical History:  Procedure Laterality Date   CESAREAN SECTION     CHOLECYSTECTOMY      Family History  Problem Relation Age of Onset  Cancer Father    Hypertension Father    Diabetes Paternal Uncle    Hyperlipidemia Paternal Uncle    Hypertension Paternal Uncle    Arthritis Paternal Grandmother    Asthma Paternal Grandmother    Diabetes Paternal Grandmother    Heart disease Paternal Grandmother    Hyperlipidemia Paternal Grandmother    Hypertension Paternal Grandmother    Miscarriages / Stillbirths Paternal Grandmother    Heart disease Paternal Grandfather     Social History   Socioeconomic History   Marital status: Divorced    Spouse name: Not on file   Number of children: Not on file   Years of education: Not on file   Highest education level: Not on file  Occupational History   Not on file  Tobacco Use   Smoking status: Every Day    Current packs/day: 1.00    Types: Cigarettes   Smokeless tobacco: Never  Vaping Use   Vaping status: Never Used  Substance and Sexual Activity   Alcohol use: No   Drug use: No   Sexual activity: Yes    Birth control/protection: Condom  Other Topics Concern   Not on file  Social History Narrative    Not on file   Social Drivers of Health   Financial Resource Strain: High Risk (12/30/2022)   Received from [facility]   Overall Financial Resource Strain (CARDIA)    Difficulty of Paying Living Expenses: Very hard  Food Insecurity: Not on file  Transportation Needs: No Transportation Needs (12/30/2022)   Received from [facility] - Transportation    Lack of Transportation (Medical): No    Lack of Transportation (Non-Medical): No  Physical Activity: Not on file  Stress: No Stress Concern Present (12/30/2022)   Received from [facility] - Occupational Stress Questionnaire    Feeling of Stress : Not at all  Social Connections: Unknown (12/29/2022)   Received from [facility]   Social Network    Social Network: Not on file  Intimate Partner Violence: Unknown (12/29/2022)   Received from [facility]   HITS    Physically Hurt: Not on file    Insult or Talk Down To: Not on file    Threaten Physical Harm: Not on file    Scream or Curse: Not on file    Review of Systems  Constitutional:  Negative for chills and fever.  HENT:  Negative for congestion and sore throat.   Eyes:  Positive for discharge and redness.  Respiratory:  Negative for cough and shortness of breath.   Gastrointestinal:  Negative for constipation, melena, nausea and vomiting.  Genitourinary:  Negative for dysuria and hematuria.  Musculoskeletal:  Negative for falls.  Skin:  Negative for itching and rash.  Neurological:  Negative for dizziness and headaches.  Endo/Heme/Allergies:        Concern for hair loss  Psychiatric/Behavioral:  Positive for depression. Negative for suicidal ideas. The patient is nervous/anxious. The patient does not have insomnia.    Negative unless indicated in HPI    Objective   BP (!) 174/90   Pulse 63   Temp 98 F (36.7 C) (Temporal)   Ht 5\' 4"  (1.626 m)   Wt 232 lb 9.6 oz (105.5 kg)   SpO2 96%   BMI 39.93 kg/m    Physical Exam Vitals and nursing note reviewed.  Constitutional:      Appearance: She is obese.  HENT:  Head: Normocephalic and atraumatic.     Right Ear: Tympanic membrane, ear canal and external ear normal. There is no impacted cerumen.     Left Ear: Tympanic membrane, ear canal and external ear normal. There is no impacted cerumen.     Nose: Nose normal.     Mouth/Throat:     Mouth: Mucous membranes are moist.  Eyes:     General: No scleral icterus.       Right eye: Discharge present.     Extraocular Movements: Extraocular movements intact.  Neck:     Vascular: No carotid bruit.  Cardiovascular:     Heart sounds: Normal heart sounds.  Pulmonary:     Effort: Pulmonary effort is normal.     Breath sounds: Normal breath sounds.  Abdominal:     General: Bowel sounds are normal.     Palpations: Abdomen is soft.  Musculoskeletal:        General: Normal range of motion.     Cervical back: Normal range of motion and neck supple. No rigidity or tenderness.     Right lower leg: No edema.     Left lower leg: No edema.  Lymphadenopathy:     Cervical: No cervical adenopathy.  Skin:    General: Skin is warm and dry.     Findings: No rash.  Neurological:     Mental Status: She is alert and oriented to person, place, and time.     Gait: Gait is intact.  Psychiatric:        Attention and Perception: Attention and perception normal.        Mood and Affect: Mood is anxious. Affect is tearful.        Speech: Speech normal.        Behavior: Behavior normal. Behavior is cooperative.        Thought Content: Thought content normal. Thought content does not include suicidal ideation. Thought content does not include suicidal plan.        Cognition and Memory: Cognition and memory normal.        Judgment: Judgment normal.     Last CBC Lab Results  Component Value Date   WBC 10.0 03/23/2024   HGB 16.1 (H) 03/23/2024   HCT 47.6 (H) 03/23/2024   MCV 91 03/23/2024   MCH 30.8  03/23/2024   RDW 13.2 03/23/2024   PLT 195 03/23/2024   Last metabolic panel Lab Results  Component Value Date   GLUCOSE 153 (H) 03/23/2024   NA 137 03/23/2024   K 4.5 03/23/2024   CL 101 03/23/2024   CO2 21 03/23/2024   BUN 5 (L) 03/23/2024   CREATININE 0.91 03/23/2024   GFRNONAA 89 12/30/2017   CALCIUM  9.1 03/23/2024   PROT 7.3 03/23/2024   ALBUMIN 4.0 03/23/2024   LABGLOB 3.3 03/23/2024   AGRATIO 1.1 (L) 12/30/2017   BILITOT 0.4 03/23/2024   ALKPHOS 140 (H) 03/23/2024   AST 22 03/23/2024   ALT 15 03/23/2024   Last lipids Lab Results  Component Value Date   CHOL 205 (H) 03/23/2024   HDL 38 (L) 03/23/2024   LDLCALC 140 (H) 03/23/2024   TRIG 147 03/23/2024   CHOLHDL 5.4 (H) 03/23/2024   Last hemoglobin A1c Lab Results  Component Value Date   HGBA1C 7.9 (H) 03/23/2024   Last thyroid  functions Lab Results  Component Value Date   TSH 7.060 (H) 12/30/2017   T4TOTAL 8.6 03/23/2024        Assessment & Plan:  Acute  bacterial conjunctivitis of right eye -     Polymyxin B -Trimethoprim ; Place 1 drop into the left eye every 4 (four) hours.  Dispense: 10 mL; Refill: 0  Acquired hypothyroidism -     Thyroid  Profile  Essential hypertension -     Comprehensive metabolic panel with GFR -     amLODIPine  Besylate; Take 1 tablet (5 mg total) by mouth daily.  Dispense: 90 tablet; Refill: 0  Generalized anxiety disorder -     CBC with Differential/Platelet -     Thyroid  Profile -     busPIRone  HCl; Take 1 tablet (7.5 mg total) by mouth 2 (two) times daily.  Dispense: 180 tablet; Refill: 0  Type 2 diabetes mellitus without complication, without long-term current use of insulin (HCC) -     Bayer DCA Hb A1c Waived -     Microalbumin / creatinine urine ratio  Class 2 obesity with body mass index (BMI) of 39.0 to 39.9 in adult, unspecified obesity type, unspecified whether serious comorbidity present -     Lipid panel -     Atorvastatin  Calcium ; Take 1 tablet (10 mg total)  by mouth daily.  Dispense: 90 tablet; Refill: 0  Myocardial infarction, unspecified MI type, unspecified artery (HCC) -     Atorvastatin  Calcium ; Take 1 tablet (10 mg total) by mouth daily.  Dispense: 90 tablet; Refill: 0  Moderate episode of recurrent major depressive disorder (HCC) -     Thyroid  Profile -     FLUoxetine  HCl; Take 1 capsule (20 mg total) by mouth daily.  Dispense: 90 capsule; Refill: 0  Screening for colon cancer -     Cologuard   Larinda Plover is a 48 year old Caucasian female seen today to establish care for chronic disease management, no acute distress   Hypertension: Not at goal plan to refill amlodipine , continue metoprolol 50 mg twice daily and losartan 50 mg daily will follow-up with nurse visit in 2 weeks for BP check if blood pressures still not well-controlled at follow-up visit going to refer to hypertensive clinic  Hypothyroidism: Thyroid  panel ordered, result pending  MDD/GAD continue Prozac  20 mg daily, start BuSpar  7.5 mg twice daily  Lab: CBC, CMP, TSH, lipid result pending  history of MI will start Karinda  on the statin, Lipitor 10 mg daily  Diabetes: Continue Ozempic 0.5 mg every 7 days, diabetic foot exam completed, micro ordered today Medical record requested from previous PCP  Health maintenance: Cologuard ordered, client instructed to make appointment for diabetic eye exam at checkout  Encourage healthy lifestyle choices, including diet (rich in fruits, vegetables, and lean proteins, and low in salt and simple carbohydrates) and exercise (at least 30 minutes of moderate physical activity daily).     The above assessment and management plan was discussed with the patient. The patient verbalized understanding of and has agreed to the management plan. Patient is aware to call the clinic if they develop any new symptoms or if symptoms persist or worsen. Patient is aware when to return to the clinic for a follow-up visit. Patient educated on when it is  appropriate to go to the emergency department.  Return in about 3 months (around 06/23/2024) for chronic Diseases management.   Alanah Sakuma [place] Thompson, DNP [facility] [address] [place], [place] [address] [phone]  Note: This document was prepared by [employer] voice dictation technology and any errors that results from this process are unintentional.

## 2024-03-23 ENCOUNTER — Encounter: Payer: Self-pay | Admitting: Nurse Practitioner

## 2024-03-23 ENCOUNTER — Ambulatory Visit: Admitting: Nurse Practitioner

## 2024-03-23 VITALS — BP 174/90 | HR 63 | Temp 98.0°F | Ht 64.0 in | Wt 232.6 lb

## 2024-03-23 DIAGNOSIS — E119 Type 2 diabetes mellitus without complications: Secondary | ICD-10-CM

## 2024-03-23 DIAGNOSIS — F411 Generalized anxiety disorder: Secondary | ICD-10-CM | POA: Diagnosis not present

## 2024-03-23 DIAGNOSIS — H1031 Unspecified acute conjunctivitis, right eye: Secondary | ICD-10-CM | POA: Insufficient documentation

## 2024-03-23 DIAGNOSIS — I219 Acute myocardial infarction, unspecified: Secondary | ICD-10-CM

## 2024-03-23 DIAGNOSIS — Z1211 Encounter for screening for malignant neoplasm of colon: Secondary | ICD-10-CM

## 2024-03-23 DIAGNOSIS — F331 Major depressive disorder, recurrent, moderate: Secondary | ICD-10-CM

## 2024-03-23 DIAGNOSIS — Z7985 Long-term (current) use of injectable non-insulin antidiabetic drugs: Secondary | ICD-10-CM

## 2024-03-23 DIAGNOSIS — E039 Hypothyroidism, unspecified: Secondary | ICD-10-CM | POA: Diagnosis not present

## 2024-03-23 DIAGNOSIS — Z6839 Body mass index (BMI) 39.0-39.9, adult: Secondary | ICD-10-CM

## 2024-03-23 DIAGNOSIS — I10 Essential (primary) hypertension: Secondary | ICD-10-CM

## 2024-03-23 DIAGNOSIS — E66812 Obesity, class 2: Secondary | ICD-10-CM

## 2024-03-23 LAB — LIPID PANEL

## 2024-03-23 LAB — BAYER DCA HB A1C WAIVED: HB A1C (BAYER DCA - WAIVED): 7.9 % — ABNORMAL HIGH (ref 4.8–5.6)

## 2024-03-23 MED ORDER — BUSPIRONE HCL 7.5 MG PO TABS: 7.5000 mg | ORAL_TABLET | Freq: Two times a day (BID) | ORAL | 0 refills | Status: AC

## 2024-03-23 MED ORDER — ATORVASTATIN CALCIUM 10 MG PO TABS: 10.0000 mg | ORAL_TABLET | Freq: Every day | ORAL | 0 refills | Status: AC

## 2024-03-23 MED ORDER — POLYMYXIN B-TRIMETHOPRIM 10000-0.1 UNIT/ML-% OP SOLN: 1.0000 [drp] | OPHTHALMIC | 0 refills | Status: AC

## 2024-03-23 MED ORDER — FLUOXETINE HCL 20 MG PO CAPS: 20.0000 mg | ORAL_CAPSULE | Freq: Every day | ORAL | 0 refills | Status: AC

## 2024-03-23 MED ORDER — AMLODIPINE BESYLATE 5 MG PO TABS: 5.0000 mg | ORAL_TABLET | Freq: Every day | ORAL | 0 refills | Status: AC

## 2024-03-24 LAB — COMPREHENSIVE METABOLIC PANEL WITH GFR
ALT: 15 IU/L (ref 0–32)
AST: 22 IU/L (ref 0–40)
Albumin: 4 g/dL (ref 3.9–4.9)
Alkaline Phosphatase: 140 IU/L — ABNORMAL HIGH (ref 44–121)
BUN/Creatinine Ratio: 5 — ABNORMAL LOW (ref 9–23)
BUN: 5 mg/dL — ABNORMAL LOW (ref 6–24)
Bilirubin Total: 0.4 mg/dL (ref 0.0–1.2)
CO2: 21 mmol/L (ref 20–29)
Calcium: 9.1 mg/dL (ref 8.7–10.2)
Chloride: 101 mmol/L (ref 96–106)
Creatinine, Ser: 0.91 mg/dL (ref 0.57–1.00)
Globulin, Total: 3.3 g/dL (ref 1.5–4.5)
Glucose: 153 mg/dL — ABNORMAL HIGH (ref 70–99)
Potassium: 4.5 mmol/L (ref 3.5–5.2)
Sodium: 137 mmol/L (ref 134–144)
Total Protein: 7.3 g/dL (ref 6.0–8.5)
eGFR: 78 mL/min/{1.73_m2} (ref >59)

## 2024-03-24 LAB — CBC WITH DIFFERENTIAL/PLATELET
Basophils Absolute: 0.1 10*3/uL (ref 0.0–0.2)
Basos: 1 %
EOS (ABSOLUTE): 0.6 10*3/uL — ABNORMAL HIGH (ref 0.0–0.4)
Eos: 6 %
Hematocrit: 47.6 % — ABNORMAL HIGH (ref 34.0–46.6)
Hemoglobin: 16.1 g/dL — ABNORMAL HIGH (ref 11.1–15.9)
Immature Grans (Abs): 0 10*3/uL (ref 0.0–0.1)
Immature Granulocytes: 0 %
Lymphocytes Absolute: 3.2 10*3/uL — ABNORMAL HIGH (ref 0.7–3.1)
Lymphs: 32 %
MCH: 30.8 pg (ref 26.6–33.0)
MCHC: 33.8 g/dL (ref 31.5–35.7)
MCV: 91 fL (ref 79–97)
Monocytes Absolute: 0.7 10*3/uL (ref 0.1–0.9)
Monocytes: 7 %
Neutrophils Absolute: 5.3 10*3/uL (ref 1.4–7.0)
Neutrophils: 54 %
Platelets: 195 10*3/uL (ref 150–450)
RBC: 5.22 x10E6/uL (ref 3.77–5.28)
RDW: 13.2 % (ref 11.7–15.4)
WBC: 10 10*3/uL (ref 3.4–10.8)

## 2024-03-24 LAB — LIPID PANEL
Chol/HDL Ratio: 5.4 ratio — ABNORMAL HIGH (ref 0.0–4.4)
Cholesterol, Total: 205 mg/dL — ABNORMAL HIGH (ref 100–199)
HDL: 38 mg/dL — ABNORMAL LOW (ref >39)
LDL Chol Calc (NIH): 140 mg/dL — ABNORMAL HIGH (ref 0–99)
Triglycerides: 147 mg/dL (ref 0–149)
VLDL Cholesterol Cal: 27 mg/dL (ref 5–40)

## 2024-03-24 LAB — MICROALBUMIN / CREATININE URINE RATIO
Creatinine, Urine: 33.6 mg/dL
Microalb/Creat Ratio: <9 mg/g{creat} (ref 0–29)
Microalbumin, Urine: <3 ug/mL

## 2024-03-24 LAB — THYROID PANEL
Free Thyroxine Index: 2 (ref 1.2–4.9)
T3 Uptake Ratio: 23 % — ABNORMAL LOW (ref 24–39)
T4, Total: 8.6 ug/dL (ref 4.5–12.0)

## 2024-04-04 ENCOUNTER — Telehealth: Payer: Self-pay | Admitting: Nurse Practitioner

## 2024-04-08 LAB — COLOGUARD: COLOGUARD: POSITIVE — AB

## 2024-04-11 ENCOUNTER — Ambulatory Visit: Payer: Self-pay | Admitting: Nurse Practitioner

## 2024-04-11 DIAGNOSIS — R195 Other fecal abnormalities: Secondary | ICD-10-CM | POA: Insufficient documentation

## 2024-04-24 ENCOUNTER — Telehealth: Payer: Self-pay | Admitting: Nurse Practitioner

## 2024-05-30 ENCOUNTER — Other Ambulatory Visit: Payer: Self-pay | Admitting: Medical Genetics

## 2024-05-31 ENCOUNTER — Other Ambulatory Visit (HOSPITAL_COMMUNITY)

## 2024-06-02 ENCOUNTER — Other Ambulatory Visit (HOSPITAL_COMMUNITY)

## 2024-06-23 ENCOUNTER — Telehealth: Payer: Self-pay | Admitting: Nurse Practitioner

## 2024-06-23 NOTE — Telephone Encounter (Signed)
 NTBS with PCP  Copied from CRM [id number]. Topic: Appointments - Scheduling Inquiry for Clinic >> Jun 23, 2024  2:21 PM Jasmin G wrote: Reason for CRM: Pt rescheduled her appt from initially Aug 11th, when I asked her for the reason of the appt she was unsure and I could not find one either on Notes, I scheduled her with another provider on Aug 22nd as she stated that she could only come in on Fridays, please call her back at [phone] if this needs to be corrected.

## 2024-06-26 ENCOUNTER — Ambulatory Visit: Admitting: Nurse Practitioner

## 2024-07-07 ENCOUNTER — Telehealth: Payer: Self-pay | Admitting: Nurse Practitioner

## 2024-07-07 ENCOUNTER — Ambulatory Visit: Admitting: Family Medicine

## 2024-07-07 NOTE — Telephone Encounter (Signed)
 Patient came in for appointment that was scheduled incorrectly by [id number].  She would like to switch from seeing [facility] to [facility].  I explained to patient we would first need to get the okay from the providers before we can switch.  Please let me know how to proceed.

## 2024-07-07 NOTE — Telephone Encounter (Signed)
 I'm not taking on other provider's patients right now as I am currently overwhelmed with the absorbing [facility] patients and new patients to the office.

## 2024-07-10 NOTE — Telephone Encounter (Signed)
 Left message that Mary Riggs could not accept her as a pt and for her to call back if she wanted to ask an another provider that is taking patients

## 2024-09-20 ENCOUNTER — Other Ambulatory Visit: Payer: Self-pay | Admitting: Medical Genetics

## 2024-09-20 DIAGNOSIS — Z006 Encounter for examination for normal comparison and control in clinical research program: Secondary | ICD-10-CM
